# Patient Record
Sex: Female | Born: 1970 | Hispanic: Yes | Marital: Single | Smoking: Never smoker
Health system: Southern US, Community
[De-identification: ages and names within clinical notes are randomized; demographics above are authoritative.]

## PROBLEM LIST (undated history)

## (undated) DIAGNOSIS — F419 Anxiety disorder, unspecified: Secondary | ICD-10-CM

## (undated) DIAGNOSIS — E119 Type 2 diabetes mellitus without complications: Secondary | ICD-10-CM

## (undated) DIAGNOSIS — K219 Gastro-esophageal reflux disease without esophagitis: Secondary | ICD-10-CM

## (undated) DIAGNOSIS — E7800 Pure hypercholesterolemia, unspecified: Secondary | ICD-10-CM

## (undated) HISTORY — PX: OTHER SURGICAL HISTORY: SHX169

## (undated) HISTORY — PX: BREAST SURGERY: SHX581

## (undated) HISTORY — PX: TUBAL LIGATION: SHX77

---

## 2002-02-01 ENCOUNTER — Encounter: Payer: Self-pay | Admitting: Emergency Medicine

## 2002-02-01 ENCOUNTER — Emergency Department (HOSPITAL_COMMUNITY): Admission: EM | Admit: 2002-02-01 | Discharge: 2002-02-01 | Payer: Self-pay | Admitting: Emergency Medicine

## 2002-03-12 ENCOUNTER — Ambulatory Visit (HOSPITAL_COMMUNITY): Admission: RE | Admit: 2002-03-12 | Discharge: 2002-03-12 | Payer: Self-pay | Admitting: General Surgery

## 2002-03-12 ENCOUNTER — Encounter: Payer: Self-pay | Admitting: General Surgery

## 2002-04-03 ENCOUNTER — Encounter: Admission: RE | Admit: 2002-04-03 | Discharge: 2002-04-03 | Payer: Self-pay | Admitting: Oncology

## 2002-04-03 ENCOUNTER — Encounter (HOSPITAL_COMMUNITY): Admission: RE | Admit: 2002-04-03 | Discharge: 2002-05-03 | Payer: Self-pay | Admitting: Oncology

## 2002-04-04 ENCOUNTER — Encounter (HOSPITAL_COMMUNITY): Payer: Self-pay | Admitting: Oncology

## 2002-04-28 ENCOUNTER — Emergency Department (HOSPITAL_COMMUNITY): Admission: EM | Admit: 2002-04-28 | Discharge: 2002-04-28 | Payer: Self-pay | Admitting: Emergency Medicine

## 2002-05-06 ENCOUNTER — Emergency Department (HOSPITAL_COMMUNITY): Admission: EM | Admit: 2002-05-06 | Discharge: 2002-05-06 | Payer: Self-pay | Admitting: Emergency Medicine

## 2002-05-23 ENCOUNTER — Ambulatory Visit (HOSPITAL_COMMUNITY): Admission: RE | Admit: 2002-05-23 | Discharge: 2002-05-23 | Payer: Self-pay | Admitting: Internal Medicine

## 2002-05-24 ENCOUNTER — Encounter: Payer: Self-pay | Admitting: Internal Medicine

## 2002-05-24 ENCOUNTER — Ambulatory Visit (HOSPITAL_COMMUNITY): Admission: RE | Admit: 2002-05-24 | Discharge: 2002-05-24 | Payer: Self-pay | Admitting: Internal Medicine

## 2002-08-15 ENCOUNTER — Ambulatory Visit (HOSPITAL_COMMUNITY): Admission: RE | Admit: 2002-08-15 | Discharge: 2002-08-15 | Payer: Self-pay | Admitting: Internal Medicine

## 2003-07-07 ENCOUNTER — Emergency Department (HOSPITAL_COMMUNITY): Admission: EM | Admit: 2003-07-07 | Discharge: 2003-07-07 | Payer: Self-pay | Admitting: *Deleted

## 2004-02-19 ENCOUNTER — Encounter (HOSPITAL_COMMUNITY): Admission: RE | Admit: 2004-02-19 | Discharge: 2004-03-20 | Payer: Self-pay | Admitting: Preventative Medicine

## 2004-11-30 ENCOUNTER — Ambulatory Visit (HOSPITAL_COMMUNITY): Admission: RE | Admit: 2004-11-30 | Discharge: 2004-11-30 | Payer: Self-pay | Admitting: Otolaryngology

## 2004-12-22 ENCOUNTER — Emergency Department (HOSPITAL_COMMUNITY): Admission: EM | Admit: 2004-12-22 | Discharge: 2004-12-22 | Payer: Self-pay | Admitting: Emergency Medicine

## 2004-12-23 ENCOUNTER — Ambulatory Visit (HOSPITAL_COMMUNITY): Admission: RE | Admit: 2004-12-23 | Discharge: 2004-12-23 | Payer: Self-pay | Admitting: Emergency Medicine

## 2005-01-20 ENCOUNTER — Ambulatory Visit: Payer: Self-pay | Admitting: Urgent Care

## 2005-02-01 ENCOUNTER — Emergency Department (HOSPITAL_COMMUNITY): Admission: EM | Admit: 2005-02-01 | Discharge: 2005-02-01 | Payer: Self-pay | Admitting: Emergency Medicine

## 2006-03-08 ENCOUNTER — Ambulatory Visit (HOSPITAL_COMMUNITY): Admission: RE | Admit: 2006-03-08 | Discharge: 2006-03-08 | Payer: Self-pay | Admitting: Family Medicine

## 2006-04-06 ENCOUNTER — Ambulatory Visit (HOSPITAL_COMMUNITY): Admission: RE | Admit: 2006-04-06 | Discharge: 2006-04-06 | Payer: Self-pay | Admitting: Family Medicine

## 2006-08-04 ENCOUNTER — Emergency Department (HOSPITAL_COMMUNITY): Admission: EM | Admit: 2006-08-04 | Discharge: 2006-08-04 | Payer: Self-pay | Admitting: Emergency Medicine

## 2007-01-11 ENCOUNTER — Emergency Department (HOSPITAL_COMMUNITY): Admission: EM | Admit: 2007-01-11 | Discharge: 2007-01-11 | Payer: Self-pay | Admitting: Emergency Medicine

## 2007-01-12 ENCOUNTER — Emergency Department (HOSPITAL_COMMUNITY): Admission: EM | Admit: 2007-01-12 | Discharge: 2007-01-12 | Payer: Self-pay | Admitting: Emergency Medicine

## 2008-06-18 ENCOUNTER — Emergency Department (HOSPITAL_COMMUNITY): Admission: EM | Admit: 2008-06-18 | Discharge: 2008-06-18 | Payer: Self-pay | Admitting: Emergency Medicine

## 2008-06-24 ENCOUNTER — Ambulatory Visit (HOSPITAL_COMMUNITY): Admission: RE | Admit: 2008-06-24 | Discharge: 2008-06-24 | Payer: Self-pay | Admitting: Family Medicine

## 2008-07-18 ENCOUNTER — Ambulatory Visit (HOSPITAL_COMMUNITY): Admission: RE | Admit: 2008-07-18 | Discharge: 2008-07-18 | Payer: Self-pay | Admitting: Family Medicine

## 2008-07-25 ENCOUNTER — Ambulatory Visit (HOSPITAL_COMMUNITY): Admission: RE | Admit: 2008-07-25 | Discharge: 2008-07-25 | Payer: Self-pay | Admitting: Family Medicine

## 2008-07-25 ENCOUNTER — Encounter (INDEPENDENT_AMBULATORY_CARE_PROVIDER_SITE_OTHER): Payer: Self-pay | Admitting: Radiology

## 2009-02-13 ENCOUNTER — Ambulatory Visit (HOSPITAL_COMMUNITY): Admission: RE | Admit: 2009-02-13 | Discharge: 2009-02-13 | Payer: Self-pay | Admitting: Family Medicine

## 2009-09-30 ENCOUNTER — Ambulatory Visit (HOSPITAL_COMMUNITY): Admission: RE | Admit: 2009-09-30 | Discharge: 2009-09-30 | Payer: Self-pay | Admitting: Family Medicine

## 2010-04-15 ENCOUNTER — Emergency Department (HOSPITAL_COMMUNITY): Admission: EM | Admit: 2010-04-15 | Discharge: 2010-04-15 | Payer: Self-pay | Admitting: Emergency Medicine

## 2010-07-22 ENCOUNTER — Ambulatory Visit (HOSPITAL_COMMUNITY)
Admission: RE | Admit: 2010-07-22 | Discharge: 2010-07-22 | Payer: Self-pay | Source: Home / Self Care | Attending: Family Medicine | Admitting: Family Medicine

## 2010-07-27 ENCOUNTER — Ambulatory Visit (HOSPITAL_COMMUNITY)
Admission: RE | Admit: 2010-07-27 | Discharge: 2010-07-27 | Payer: Self-pay | Source: Home / Self Care | Attending: General Surgery | Admitting: General Surgery

## 2010-08-09 HISTORY — PX: BREAST SURGERY: SHX581

## 2010-10-15 ENCOUNTER — Other Ambulatory Visit: Payer: Self-pay

## 2010-10-15 DIAGNOSIS — Z139 Encounter for screening, unspecified: Secondary | ICD-10-CM

## 2010-10-19 LAB — CBC
Hemoglobin: 13.8 g/dL (ref 12.0–15.0)
Platelets: 207 10*3/uL (ref 150–400)
RBC: 4.31 MIL/uL (ref 3.87–5.11)
WBC: 10.3 10*3/uL (ref 4.0–10.5)

## 2010-10-19 LAB — BASIC METABOLIC PANEL
BUN: 6 mg/dL (ref 6–23)
Chloride: 104 mEq/L (ref 96–112)
GFR calc Af Amer: >60 mL/min (ref >60)
Glucose, Bld: 104 mg/dL — ABNORMAL HIGH (ref 70–99)
Sodium: 139 mEq/L (ref 135–145)

## 2010-10-19 LAB — HCG, QUANTITATIVE, PREGNANCY: hCG, Beta Chain, Quant, S: <2 m[IU]/mL (ref <5)

## 2010-10-19 LAB — SURGICAL PCR SCREEN: MRSA, PCR: NEGATIVE

## 2010-10-23 ENCOUNTER — Ambulatory Visit (HOSPITAL_COMMUNITY)
Admission: RE | Admit: 2010-10-23 | Discharge: 2010-10-23 | Disposition: A | Discharge status: Home / Self Care | Payer: PRIVATE HEALTH INSURANCE | Source: Ambulatory Visit | Attending: Family Medicine | Admitting: Family Medicine

## 2010-10-23 DIAGNOSIS — Z139 Encounter for screening, unspecified: Secondary | ICD-10-CM

## 2010-10-23 DIAGNOSIS — Z1231 Encounter for screening mammogram for malignant neoplasm of breast: Secondary | ICD-10-CM | POA: Insufficient documentation

## 2010-12-25 NOTE — Consult Note (Signed)
NAME:  Mary [name]                       ACCOUNT NO.:  [id number]   MEDICAL RECORD NO.:  [id number]                   PATIENT TYPE:   LOCATION:                                       FACILITY:  [facility]   PHYSICIAN:  R. Roetta Sessions, M.D.              DATE OF BIRTH:  18-Oct-1970   DATE OF CONSULTATION:  05/15/2002  DATE OF DISCHARGE:                                   CONSULTATION   j   REASON FOR CONSULTATION:  Abdominal pain.   HISTORY OF PRESENT ILLNESS:  The patient is a pleasant 40 year old Hispanic  female patient of Dr. Malvin Johns who was referred today for further evaluation  of abdominal pain.  She has a several month history of various GI  complaints.  She was seen by Dr. Colon Branch in the emergency department back in  June 2003.  At that time she was complaining of left lower chest pain.  She  states there was nothing found to explain her pain.  She went back to the  emergency department April 28, 2002 for substernal type pain associated  with shortness of breath and diaphoresis.  She does not recall the diagnosis  at that time.  She reports that she eventually went to [facility] on September 28 and was diagnosed with acid reflux.  She was started on Maalox and Prilosec.  Since then the pain has went away.  She no longer has the substernal pain/pressure that she had had previously.  She denies any nausea other than when she took some Darvocet.  No vomiting.  No dysphagia, odynophagia.  She had seen Dr. Malvin Johns who according to her  thought that her chest pain was secondary to nerves.  She was using Advil  two tablets p.o. p.r.n. chest wall pain, generally once to twice a day  previously.  She reports that she is no longer taking this medication.  Now  her substernal pain has subsided.  However, she is having lower abdominal  pain of one week duration.  She has decreased greasy, fatty foods as well as  caffeinated fluids to try to help the  pain, but noted no relief.  Pain comes  and goes.  Not associated with eating certain foods or movement.  Denies any  fever or chills.  She denies any melena or rectal bleeding or weight loss.  Her bowels move once to twice per week previously, but now daily since she  has been on Prilosec.  Translation was provided by her friend, Tomasita Crumble.   Since her work-up was initiated in June for her chest pain she has had  multiple studies.  On March 01, 2002 she was noted to have a hemoglobin of  11.9, hematocrit 34.9, MCV of 86.4.  Iron studies were normal except for low  saturation of 16.  Ferritin was low normal at 11.  Vitamin B12 and folate  were normal.  She had a chest x-ray which revealed scoliosis, but no other  abnormalities.  Last ferritin on August 26 was 17.  She had a sedimentation  rate of 10.  Rheumatoid factor less than 20.  She saw Dr. Mariel Sleet for  reasons unclear to me, however, apparently was due to anemia felt to be iron  deficient.  I only received part of his note as forwarded from Dr. Malvin Johns.   CURRENT MEDICATIONS:  1. Prilosec 20 mg q.d.  2. Multivitamin q.d.  3. Maalox one tablespoon q.i.d.   ALLERGIES:  No known drug allergies.   PAST MEDICAL HISTORY:  1. History of mild normocytic anemia felt to be iron deficient.  2. Scoliosis based on x-ray.  3. Cesarean section x3.   FAMILY HISTORY:  No family history of chronic GI illnesses, liver disease,  or colon cancer.   SOCIAL HISTORY:  She has been married for 18 years.  Has three children.  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  She has  been living in the [place] for six to eight years.  She speaks and  understands some [place].  Denies any tobacco or alcohol use.   REVIEW OF SYMPTOMS:  Please see HPI for GI, general, cardiopulmonary,  genitourinary.  She has regular menses.  She has moderate amount of flow.   PHYSICAL EXAMINATION:  VITAL SIGNS:  Weight 121.5,  height 5 feet,  temperature 98.2, blood pressure 100/60, pulse 74.  GENERAL:  Pleasant young, well-nourished, well-developed Hispanic female in  no acute distress.  She is accompanied by a friend who is her Nurse, learning disability.  SKIN:  Warm and dry.  No jaundice.  HEENT:  Pupils are equal, round, and reactive to light.  Conjunctivae are  pink.  Sclerae nonicteric.  Oropharyngeal mucosa moist and pink.  No  lesions, erythema, or exudate.  NECK:  No lymphadenopathy, thyromegaly, carotid bruits.  CHEST:  Lungs are clear to auscultation.  CARDIAC:  Regular rate and rhythm.  Normal S1, S2.  No murmur, rub, or  gallop.  ABDOMEN:  Positive bowel sounds, soft, nondistended.  She has moderate  tenderness in the epigastric region to deep palpation.  No organomegaly or  masses.  RECTAL:  Adequate sphincter tone.  No masses in the rectal vault.  Secretions are Hemoccult positive.  EXTREMITIES:  No edema.   IMPRESSION:  The patient is a 40 year old Hispanic female who has had a  multitude of gastrointestinal complaints.  Initially, she was having some  left lower chest wall pain which apparently has resolved.  She has been to  the ED with substernal chest pain felt to be related to acid reflux  according to the discharge papers that she received.  Those symptoms have  now resolved since being on Prilosec and Maalox.  On examination today she  is quite tender in the epigastric region.  She has a history of taking Advil  regularly for chest wall pain.  She was Hemoccult positive.  I am concerned  about NSAID induced mucosal injury or even peptic ulcer disease.  Cannot  rule out biliary disease as the cause of some of her upper abdominal pain.  She complains of lower abdominal pain; however, I believe that there is some  loss of accuracy during translation.  When asked where she hurts she points  to the mid epigastric region.  We discussed today EGD to further evaluate epigastric pain, heme-positive stools  and she is agreeable to proceed.  We  discussed risks, alternatives, and benefits regarding her mild anemia with  low ferritin and iron saturation.  This may be simply secondary to regular  menses; however, do need to rule out slow upper gastrointestinal bleed.   PLAN:  1. EGD in the near future.  2. Will obtain LFTs and CBC.  3.     I did supply an excuse from work until we complete EGD and blood work.  The      patient complained of feeling very weak; however, no physical signs of     significant anemia on examination.   I would like to thank Dr. Malvin Johns for allowing [place] to take part in the care  of this patient.     Tana Coast, Pricilla Larsson, M.D.    LL/MEDQ  D:  05/15/2002  T:  05/16/2002  Job:  [id number]   cc:   Marlane Hatcher, M.D.   Jonathon Bellows, M.D.  [address]  [place]  [place] [address]  Fax: [phone]

## 2010-12-25 NOTE — Procedures (Signed)
[facility]  Patient:    Mary [name], Mary [name] Visit Number: [id number] MRN: [id number]          Service Type: EMS Location: ED Attending Physician:  Annamarie Dawley Dictated by:   Kari Baars, M.D. Proc. Date: 02/01/02 Admit Date:  02/01/2002 Discharge Date: 02/01/2002                            EKG Interpretations  Normal electrocardiogram. Dictated by:   Kari Baars, M.D. Attending Physician:  Annamarie Dawley DD:  02/03/02 TD:  02/05/02 Job: [id number]

## 2010-12-25 NOTE — Op Note (Signed)
NAME:  [name], Mary                       ACCOUNT NO.:  [id number]   MEDICAL RECORD NO.:  [id number]                   PATIENT TYPE:  AMB   LOCATION:  DAY                                  FACILITY:  [facility]   PHYSICIAN:  R. Roetta Sessions, M.D.              DATE OF BIRTH:  [birth year]   DATE OF PROCEDURE:  DATE OF DISCHARGE:                                 OPERATIVE REPORT   PROCEDURE:  Esophagogastroduodenoscopy with Maloney dilation.   ENDOSCOPIST:  Gerrit Friends. Rourk, M.D.   INDICATIONS FOR PROCEDURE:  The patient is a 40 year old lady with recent  epigastric chest pain. She was taken Advil.  This was stopped.  She was  started on Prilosec. Her symptoms have almost completely subsided.  She does  describe intermittent esophageal dysphagia.  Aside from dysphagia, at this  point in time, she is asymptomatic.  As far as I can tell she has not had an  abdominal ultrasound to screen for occult gallstones.  EGD is now being done  to further evaluate her recent chest and epigastric pain and esophageal  dysphagia.  Please see the documentation in the medical record for more  information.  I feel that she is low risk for conscious sedation.   DESCRIPTION OF PROCEDURE:  O2 saturation, blood pressure, pulse and  respirations were monitored throughout the entire procedure.   CONSCIOUS SEDATION:  Versed 4 mg IV, Demerol 50 mg IV in divided doses,   INSTRUMENT:  Olympus video chip adult gastroscope.   FINDINGS:  Examination of the tubular esophagus revealed an incomplete  Schatzki ring at the EG junction.  The esophageal mucosa otherwise appeared  normal.  The EG junction was easily traversed.   STOMACH:  The gastric cavity insufflated well with air.  A thorough  examination of the gastric mucosa including a retroflex view of the proximal  stomach and esophagogastric junction; demonstrated no abnormalities.  The pylorus was patent and easily traversed.   DUODENUM:  The duodenum, the  bulb, and the second portion appeared normal.   THERAPY/DIAGNOSTIC MANEUVERS:  A 54 French Maloney dilator was passed to  full insertion with ease.  A look back revealed no apparent complication  related to the passage of the dilator.  The patient tolerated the procedure  well and was reacted in endoscopy.   IMPRESSION:  1. Incomplete, noncritical appearing Schatzki ring, status post dilation as     described above.  2. The remainder of the esophageal mucosa appeared normal.  3. Normal gastric mucosa.  4. Normal D1, D2.    RECOMMENDATIONS:  1. Continue Prilosec 20 mg orally daily for the time being.  2. Continue to avoid nonsteroidals.  3. Will go ahead and proceed with an abdominal ultrasound just to make sure     that this lady does not have occult gallstones.  4. Office visit with [place] in 4 weeks.  Jonathon Bellows, M.D.    RMR/MEDQ  D:  05/23/2002  T:  05/23/2002  Job:  [id number]   cc:   Barbaraann Barthel, MD

## 2010-12-25 NOTE — Op Note (Signed)
NAME:  [name], Mary                       ACCOUNT NO.:  [id number]   MEDICAL RECORD NO.:  [id number]                   PATIENT TYPE:  AMB   LOCATION:  DAY                                  FACILITY:  [facility]   PHYSICIAN:  R. Roetta Sessions, M.D.              DATE OF BIRTH:  1970-09-07   DATE OF PROCEDURE:  DATE OF DISCHARGE:                                 OPERATIVE REPORT   PROCEDURE PERFORMED:  Diagnostic colonoscopy.   INDICATIONS FOR PROCEDURE:  The patient is a 40 year old Hispanic woman with  Hemoccult-positive stool.  She had a low iron saturation.  Her CBC was  previously normal.  She underwent an esophagogastroduodenoscopy which  demonstrated Schatzki's ring which was dilated, otherwise the upper GI tract  appeared normal.  Subsequent to the EGD, she had one of her Hemoccult cards  positive.  She is really pretty much asymptomatic at this time.  She denies  abdominal pain, no melena or rectal bleeding.  No change in bowel habits.  Colonoscopy is now being done to complete the GI evaluation.  The approach  has been discussed with the patient previously and again today at the  bedside.  Potential risks, benefits, and alternatives have been reviewed.  Please see the documentation in the medical record ASA1.   DESCRIPTION OF PROCEDURE:  O2 saturation, blood pressure and pulse oximeters  were monitored and normal throughout the entire procedure.  Conscious  sedation of Versed 4 mg IV, Demerol 50 mg IV in divided doses.  The  instrument was the Olympus video tip adult colonoscope.  Findings: Digital  rectal examination revealed no abnormalities.  The quality of the prep was  good.  Rectum:  Examination of the rectal mucosa including retroflexion of  the anal verge revealed no abnormalities.  Colon:  The colonic mucosa was  surveyed from the rectosigmoid junction through the left, transverse and  right colon to the area of the appendiceal orifice and ileocecal valve and  cecum.   Structures were well seen and photographed for the record.  The  colonic mucosa and cecum appeared normal from the level of the cecum to the  ileocecal valve.  The scope was slowly withdrawn.  All previously mentioned  mucosal surfaces were again seen and no abnormalities were observed.  The  patient tolerated the procedure well, was reactive at endoscopy.   IMPRESSION:  1. Normal rectum.  2. Normal colon.   RECOMMENDATIONS:  Follow up with [place] in the office in six weeks to make sure  she is doing well prior to concluding the consultation.                                                Jonathon Bellows, M.D.    RMR/MEDQ  D:  08/15/2002  T:  08/15/2002  Job:  [id number]

## 2011-05-27 LAB — URINALYSIS, ROUTINE W REFLEX MICROSCOPIC
Bilirubin Urine: NEGATIVE
Ketones, ur: NEGATIVE
Nitrite: NEGATIVE
Protein, ur: NEGATIVE
Specific Gravity, Urine: <1.005 — ABNORMAL LOW
Urobilinogen, UA: 0.2

## 2011-05-27 LAB — BASIC METABOLIC PANEL
CO2: 22
Chloride: 102
GFR calc Af Amer: >60
Glucose, Bld: 122 — ABNORMAL HIGH
Potassium: 3.4 — ABNORMAL LOW
Sodium: 130 — ABNORMAL LOW

## 2011-05-27 LAB — CBC
HCT: 30.6 — ABNORMAL LOW
Hemoglobin: 10.6 — ABNORMAL LOW
MCHC: 34.6
RBC: 3.54 — ABNORMAL LOW
RDW: 13.9

## 2011-05-27 LAB — URINE MICROSCOPIC-ADD ON

## 2011-05-27 LAB — DIFFERENTIAL
Basophils Absolute: 0
Basophils Relative: 0
Eosinophils Relative: 0
Monocytes Absolute: 0.8 — ABNORMAL HIGH
Monocytes Relative: 11

## 2011-05-27 LAB — URINE CULTURE

## 2011-05-27 LAB — STREP A DNA PROBE: Group A Strep Probe: NEGATIVE

## 2012-03-28 ENCOUNTER — Other Ambulatory Visit (HOSPITAL_COMMUNITY): Payer: Self-pay | Admitting: *Deleted

## 2012-03-28 DIAGNOSIS — Z139 Encounter for screening, unspecified: Secondary | ICD-10-CM

## 2012-03-30 ENCOUNTER — Ambulatory Visit (HOSPITAL_COMMUNITY)
Admission: RE | Admit: 2012-03-30 | Discharge: 2012-03-30 | Disposition: A | Discharge status: Home / Self Care | Payer: PRIVATE HEALTH INSURANCE | Source: Ambulatory Visit | Attending: *Deleted | Admitting: *Deleted

## 2012-03-30 DIAGNOSIS — Z1231 Encounter for screening mammogram for malignant neoplasm of breast: Secondary | ICD-10-CM | POA: Insufficient documentation

## 2012-03-30 DIAGNOSIS — Z139 Encounter for screening, unspecified: Secondary | ICD-10-CM

## 2012-04-20 NOTE — H&P (Signed)
PREOPERATIVE H&P  Chief Complaint: chronic tonsil problems  HPI: Mary [name] is a 41 y.o. female who presents for evaluation of chronic tonsil problems with large tonsils and frequent sore throats. She also has tonsilliths and odor in her mouth.  No past medical history on file. No past surgical history on file. History   Social History  . Marital Status: Married    Spouse Name: N/A    Number of Children: N/A  . Years of Education: N/A   Social History Main Topics  . Smoking status: Not on file  . Smokeless tobacco: Not on file  . Alcohol Use: Not on file  . Drug Use: Not on file  . Sexually Active: Not on file   Other Topics Concern  . Not on file   Social History Narrative  . No narrative on file   No family history on file. Allergies not on file Prior to Admission medications   Not on File     Positive ROS: sore throats  All other systems have been reviewed and were otherwise negative with the exception of those mentioned in the HPI and as above.  Physical Exam: There were no vitals filed for this visit.  General: Alert, no acute distress Oral: Normal oral mucosa and 3+ tonsils Nasal: Clear nasal passages Neck: No palpable adenopathy or thyroid nodules Ear: Ear canal is clear with normal appearing TMs Cardiovascular: Regular rate and rhythm, no murmur.  Respiratory: Clear to auscultation Neurologic: Alert and oriented x 3   Assessment/Plan: chronic tonsillitis  Plan for Procedure(s): TONSILLECTOMY   Dillard Cannon, MD 04/20/2012 6:02 PM

## 2012-04-20 NOTE — Progress Notes (Signed)
Called dr Allene Pyo office to tell them I have been leaving messages on the daughters cell for the past 2 days-no return call-that is the only functioning number-made them aware having problem getting medical info-the daughters number was correct-lm with daughter again.

## 2012-04-21 ENCOUNTER — Encounter (HOSPITAL_BASED_OUTPATIENT_CLINIC_OR_DEPARTMENT_OTHER): Payer: Self-pay | Admitting: Anesthesiology

## 2012-04-21 ENCOUNTER — Encounter (HOSPITAL_BASED_OUTPATIENT_CLINIC_OR_DEPARTMENT_OTHER): Payer: Self-pay | Admitting: *Deleted

## 2012-04-21 ENCOUNTER — Ambulatory Visit (HOSPITAL_BASED_OUTPATIENT_CLINIC_OR_DEPARTMENT_OTHER)
Admission: RE | Admit: 2012-04-21 | Discharge: 2012-04-21 | Disposition: A | Discharge status: Home / Self Care | Payer: Self-pay | Source: Ambulatory Visit | Attending: Otolaryngology | Admitting: Otolaryngology

## 2012-04-21 ENCOUNTER — Encounter (HOSPITAL_BASED_OUTPATIENT_CLINIC_OR_DEPARTMENT_OTHER)
Admission: RE | Disposition: A | Discharge status: Home / Self Care | Payer: Self-pay | Source: Ambulatory Visit | Attending: Otolaryngology

## 2012-04-21 ENCOUNTER — Ambulatory Visit (HOSPITAL_BASED_OUTPATIENT_CLINIC_OR_DEPARTMENT_OTHER): Payer: Self-pay | Admitting: Anesthesiology

## 2012-04-21 DIAGNOSIS — J3501 Chronic tonsillitis: Secondary | ICD-10-CM | POA: Insufficient documentation

## 2012-04-21 HISTORY — PX: TONSILLECTOMY: SHX5217

## 2012-04-21 HISTORY — DX: Anxiety disorder, unspecified: F41.9

## 2012-04-21 HISTORY — DX: Gastro-esophageal reflux disease without esophagitis: K21.9

## 2012-04-21 SURGERY — TONSILLECTOMY
Anesthesia: General | Site: Mouth | Wound class: Clean Contaminated

## 2012-04-21 MED ORDER — AMOXICILLIN 250 MG/5ML PO SUSR: 500.0000 mg | Freq: Two times a day (BID) | ORAL | Status: AC

## 2012-04-21 MED ORDER — OXYCODONE HCL 5 MG/5ML PO SOLN: 5.0000 mg | Freq: Once | ORAL | Status: AC | PRN

## 2012-04-21 MED ORDER — SCOPOLAMINE 1 MG/3DAYS TD PT72
1.0000 | MEDICATED_PATCH | Freq: Once | TRANSDERMAL | Status: DC
  Administered 2012-04-21: 1.5 mg via TRANSDERMAL

## 2012-04-21 MED ORDER — LIDOCAINE HCL (CARDIAC) 20 MG/ML IV SOLN
INTRAVENOUS | Status: DC | PRN
  Administered 2012-04-21: 50 mg via INTRAVENOUS

## 2012-04-21 MED ORDER — ACETAMINOPHEN 10 MG/ML IV SOLN
1000.0000 mg | Freq: Once | INTRAVENOUS | Status: AC
  Administered 2012-04-21: 1000 mg via INTRAVENOUS

## 2012-04-21 MED ORDER — DEXAMETHASONE SODIUM PHOSPHATE 4 MG/ML IJ SOLN
INTRAMUSCULAR | Status: DC | PRN
  Administered 2012-04-21: 10 mg via INTRAVENOUS

## 2012-04-21 MED ORDER — HYDROMORPHONE HCL PF 1 MG/ML IJ SOLN
0.2500 mg | INTRAMUSCULAR | Status: DC | PRN
  Administered 2012-04-21: 0.5 mg via INTRAVENOUS

## 2012-04-21 MED ORDER — PROPOFOL 10 MG/ML IV BOLUS
INTRAVENOUS | Status: DC | PRN
  Administered 2012-04-21: 200 mg via INTRAVENOUS

## 2012-04-21 MED ORDER — FENTANYL CITRATE 0.05 MG/ML IJ SOLN
INTRAMUSCULAR | Status: DC | PRN
  Administered 2012-04-21: 25 ug via INTRAVENOUS
  Administered 2012-04-21: 50 ug via INTRAVENOUS
  Administered 2012-04-21: 25 ug via INTRAVENOUS

## 2012-04-21 MED ORDER — SUCCINYLCHOLINE CHLORIDE 20 MG/ML IJ SOLN
INTRAMUSCULAR | Status: DC | PRN
  Administered 2012-04-21: 100 mg via INTRAVENOUS

## 2012-04-21 MED ORDER — METOCLOPRAMIDE HCL 5 MG/ML IJ SOLN: 10.0000 mg | Freq: Once | INTRAMUSCULAR | Status: DC | PRN

## 2012-04-21 MED ORDER — CEFAZOLIN SODIUM-DEXTROSE 2-3 GM-% IV SOLR
INTRAVENOUS | Status: DC | PRN
  Administered 2012-04-21: 2 g via INTRAVENOUS

## 2012-04-21 MED ORDER — HYDROCODONE-ACETAMINOPHEN 7.5-500 MG/15ML PO SOLN: 10.0000 mL | ORAL | Status: AC | PRN

## 2012-04-21 MED ORDER — LACTATED RINGERS IV SOLN
INTRAVENOUS | Status: DC
  Administered 2012-04-21 (×2): via INTRAVENOUS

## 2012-04-21 MED ORDER — MIDAZOLAM HCL 5 MG/5ML IJ SOLN
INTRAMUSCULAR | Status: DC | PRN
  Administered 2012-04-21: 1 mg via INTRAVENOUS

## 2012-04-21 MED ORDER — OXYCODONE HCL 5 MG PO TABS
5.0000 mg | ORAL_TABLET | Freq: Once | ORAL | Status: AC | PRN
  Administered 2012-04-21: 5 mg via ORAL

## 2012-04-21 SURGICAL SUPPLY — 29 items
BANDAGE COBAN STERILE 2 (GAUZE/BANDAGES/DRESSINGS) IMPLANT
CANISTER SUCTION 1200CC (MISCELLANEOUS) ×2 IMPLANT
CATH ROBINSON RED A/P 12FR (CATHETERS) IMPLANT
CATH ROBINSON RED A/P 14FR (CATHETERS) ×1 IMPLANT
CLOTH BEACON ORANGE TIMEOUT ST (SAFETY) ×2 IMPLANT
COAGULATOR SUCT SWTCH 10FR 6 (ELECTROSURGICAL) IMPLANT
COVER MAYO STAND STRL (DRAPES) ×2 IMPLANT
ELECT COATED BLADE 2.86 ST (ELECTRODE) ×2 IMPLANT
ELECT REM PT RETURN 9FT ADLT (ELECTROSURGICAL) ×2
ELECT REM PT RETURN 9FT PED (ELECTROSURGICAL)
ELECTRODE REM PT RETRN 9FT PED (ELECTROSURGICAL) IMPLANT
ELECTRODE REM PT RTRN 9FT ADLT (ELECTROSURGICAL) IMPLANT
GAUZE SPONGE 4X4 12PLY STRL LF (GAUZE/BANDAGES/DRESSINGS) ×2 IMPLANT
GLOVE ECLIPSE 6.5 STRL STRAW (GLOVE) ×1 IMPLANT
GLOVE SS BIOGEL STRL SZ 7.5 (GLOVE) ×1 IMPLANT
GLOVE SUPERSENSE BIOGEL SZ 7.5 (GLOVE) ×1
GOWN PREVENTION PLUS XLARGE (GOWN DISPOSABLE) ×1 IMPLANT
GOWN PREVENTION PLUS XXLARGE (GOWN DISPOSABLE) IMPLANT
MARKER SKIN DUAL TIP RULER LAB (MISCELLANEOUS) IMPLANT
NS IRRIG 1000ML POUR BTL (IV SOLUTION) ×2 IMPLANT
PENCIL FOOT CONTROL (ELECTRODE) ×2 IMPLANT
SHEET MEDIUM DRAPE 40X70 STRL (DRAPES) ×2 IMPLANT
SOLUTION BUTLER CLEAR DIP (MISCELLANEOUS) ×1 IMPLANT
SPONGE TONSIL 1 RF SGL (DISPOSABLE) IMPLANT
SPONGE TONSIL 1.25 RF SGL STRG (GAUZE/BANDAGES/DRESSINGS) ×1 IMPLANT
SYR BULB 3OZ (MISCELLANEOUS) ×2 IMPLANT
TOWEL OR 17X24 6PK STRL BLUE (TOWEL DISPOSABLE) ×2 IMPLANT
TUBE CONNECTING 20X1/4 (TUBING) ×2 IMPLANT
WATER STERILE IRR 1000ML POUR (IV SOLUTION) ×1 IMPLANT

## 2012-04-21 NOTE — Anesthesia Postprocedure Evaluation (Signed)
Anesthesia Post Note  Patient: Mary [name]  Procedure(s) Performed: Procedure(s) (LRB): TONSILLECTOMY (N/A)  Anesthesia type: General  Patient location: PACU  Post pain: Pain level controlled  Post assessment: Patient's Cardiovascular Status Stable  Last Vitals:  Filed Vitals:   04/21/12 1100  BP: 112/66  Pulse: 76  Temp:   Resp: 17    Post vital signs: Reviewed and stable  Level of consciousness: alert  Complications: No apparent anesthesia complications

## 2012-04-21 NOTE — Interval H&P Note (Signed)
History and Physical Interval Note:  04/21/2012 9:01 AM  Mary [name]  has presented today for surgery, with the diagnosis of chronic tonsillitis   The various methods of treatment have been discussed with the patient and family. After consideration of risks, benefits and other options for treatment, the patient has consented to  Procedure(s) (LRB) with comments: TONSILLECTOMY (N/A) - tonsillectomy as a surgical intervention .  The patient's history has been reviewed, patient examined, no change in status, stable for surgery.  I have reviewed the patient's chart and labs.  Questions were answered to the patient's satisfaction.     Leiya Keesey

## 2012-04-21 NOTE — Anesthesia Preprocedure Evaluation (Signed)
Anesthesia Evaluation  Patient identified by MRN, date of birth, ID band Patient awake    Reviewed: Allergy & Precautions, H&P , NPO status , Patient's Chart, lab work & pertinent test results, reviewed documented beta blocker date and time   Airway Mallampati: II TM Distance: >3 FB Neck ROM: full    Dental   Pulmonary neg pulmonary ROS,  breath sounds clear to auscultation        Cardiovascular negative cardio ROS  Rhythm:regular     Neuro/Psych negative neurological ROS  negative psych ROS   GI/Hepatic Neg liver ROS, GERD-  Medicated and Controlled,  Endo/Other  negative endocrine ROS  Renal/GU negative Renal ROS  negative genitourinary   Musculoskeletal   Abdominal   Peds  Hematology negative hematology ROS (+)   Anesthesia Other Findings See surgeon's H&P   Reproductive/Obstetrics negative OB ROS                           Anesthesia Physical Anesthesia Plan  ASA: II  Anesthesia Plan: General   Post-op Pain Management:    Induction: Intravenous  Airway Management Planned: Oral ETT  Additional Equipment:   Intra-op Plan:   Post-operative Plan: Extubation in OR  Informed Consent: I have reviewed the patients History and Physical, chart, labs and discussed the procedure including the risks, benefits and alternatives for the proposed anesthesia with the patient or authorized representative who has indicated his/her understanding and acceptance.   Dental Advisory Given  Plan Discussed with: CRNA and Surgeon  Anesthesia Plan Comments:         Anesthesia Quick Evaluation  

## 2012-04-21 NOTE — Brief Op Note (Signed)
04/21/2012  10:06 AM  PATIENT:  Mary [name]  41 y.o. female  PRE-OPERATIVE DIAGNOSIS:  chronic tonsillitis   POST-OPERATIVE DIAGNOSIS:  chronic tonsillitis   PROCEDURE:  Procedure(s) (LRB) with comments: TONSILLECTOMY (N/A) - tonsillectomy  SURGEON:  Surgeon(s) and Role:    * Drema Halon, MD - Primary  PHYSICIAN ASSISTANT:   ASSISTANTS: none   ANESTHESIA:   general  EBL:  Total I/O In: 1000 [I.V.:1000] Out: -   BLOOD ADMINISTERED:none  DRAINS: none   LOCAL MEDICATIONS USED:  NONE  SPECIMEN:  No Specimen  DISPOSITION OF SPECIMEN:  N/A  COUNTS:  YES  TOURNIQUET:  * No tourniquets in log *  DICTATION: .Other Dictation: Dictation Number [id number]  PLAN OF CARE: Discharge to home after PACU  PATIENT DISPOSITION:  PACU - hemodynamically stable.   Delay start of Pharmacological VTE agent (>24hrs) due to surgical blood loss or risk of bleeding: not applicable

## 2012-04-21 NOTE — Transfer of Care (Signed)
Immediate Anesthesia Transfer of Care Note  Patient: Mary [name]  Procedure(s) Performed: Procedure(s) (LRB) with comments: TONSILLECTOMY (N/A) - tonsillectomy  Patient Location: PACU  Anesthesia Type: General  Level of Consciousness: awake, alert  and oriented  Airway & Oxygen Therapy: Patient Spontanous Breathing and Patient connected to face mask oxygen  Post-op Assessment: Report given to PACU RN and Post -op Vital signs reviewed and stable  Post vital signs: Reviewed and stable  Complications: No apparent anesthesia complications

## 2012-04-22 NOTE — Op Note (Signed)
NAMERush Farmer   ACCOUNT NO.:  [id number]  MEDICAL RECORD NO.:  [id number]  LOCATION:                                 FACILITY:  PHYSICIAN:  Kristine Garbe. Ezzard Standing, M.D. DATE OF BIRTH:  DATE OF PROCEDURE: DATE OF DISCHARGE:                              OPERATIVE REPORT   PREOPERATIVE DIAGNOSIS:  Chronic tonsillitis.  POSTOPERATIVE DIAGNOSIS:  Chronic tonsillitis.  OPERATION PERFORMED:  Tonsillectomy.  SURGEON:  Kristine Garbe. Ezzard Standing, M.D.  ANESTHESIA:  General endotracheal.  COMPLICATIONS:  None.  BRIEF CLINICAL NOTE:  Mary [name] is a 41 year old female who has had chronic problems with recurrent sore throats as well as enlarged tonsils.  She complained of frequent sore throat as well as chronic odor in her mouth.  She has large cryptic tonsils.  She was taken to the operating room at this time for tonsillectomy.  DESCRIPTION OF PROCEDURE:  After adequate endotracheal anesthesia, mouth gag was used to expose the oropharynx.  The left and right tonsils were then resected from the tonsillar fossa using a cautery.  There was really no apparent tonsillar abnormalities noted.  Hemostasis was obtained with cautery.  After obtaining adequate hemostasis, oropharynx was irrigated with saline.  Care was taken to preserve the anteroposterior tonsillar pillars as well as uvula.  Following tonsillectomy, the patient was awakened from anesthesia and transferred to the recovery room, postop doing well.  DISPOSITION:  The patient received 2 g of Ancef and 10 mg of Decadron IV preoperatively.  She is discharged to home on amoxicillin suspension 500 mg b.i.d. for 1 week, Tylenol and Lortab Elixir 10-15 mL q.4 hours p.r.n. pain.  However, follow up in my office in 10-14 days for recheck.          ______________________________ Kristine Garbe. Ezzard Standing, M.D.     CEN/MEDQ  D:  04/21/2012  T:  04/22/2012  Job:  [id number]

## 2012-04-25 ENCOUNTER — Encounter (HOSPITAL_BASED_OUTPATIENT_CLINIC_OR_DEPARTMENT_OTHER): Payer: Self-pay | Admitting: Otolaryngology

## 2013-04-24 ENCOUNTER — Other Ambulatory Visit (HOSPITAL_COMMUNITY): Payer: Self-pay | Admitting: *Deleted

## 2013-04-24 DIAGNOSIS — Z139 Encounter for screening, unspecified: Secondary | ICD-10-CM

## 2013-04-30 ENCOUNTER — Ambulatory Visit (HOSPITAL_COMMUNITY)
Admission: RE | Admit: 2013-04-30 | Discharge: 2013-04-30 | Disposition: A | Discharge status: Home / Self Care | Payer: Self-pay | Source: Ambulatory Visit | Attending: *Deleted | Admitting: *Deleted

## 2013-04-30 DIAGNOSIS — Z139 Encounter for screening, unspecified: Secondary | ICD-10-CM

## 2013-05-03 ENCOUNTER — Other Ambulatory Visit (HOSPITAL_COMMUNITY): Payer: Self-pay | Admitting: Nurse Practitioner

## 2013-05-03 DIAGNOSIS — N6320 Unspecified lump in the left breast, unspecified quadrant: Secondary | ICD-10-CM

## 2013-05-09 ENCOUNTER — Other Ambulatory Visit (HOSPITAL_COMMUNITY): Payer: Self-pay | Admitting: Nurse Practitioner

## 2013-05-09 ENCOUNTER — Encounter (HOSPITAL_COMMUNITY): Payer: Self-pay

## 2013-05-09 ENCOUNTER — Ambulatory Visit (HOSPITAL_COMMUNITY)
Admission: RE | Admit: 2013-05-09 | Discharge: 2013-05-09 | Disposition: A | Discharge status: Home / Self Care | Payer: PRIVATE HEALTH INSURANCE | Source: Ambulatory Visit | Attending: Nurse Practitioner | Admitting: Nurse Practitioner

## 2013-05-09 DIAGNOSIS — N6320 Unspecified lump in the left breast, unspecified quadrant: Secondary | ICD-10-CM

## 2013-05-09 DIAGNOSIS — R928 Other abnormal and inconclusive findings on diagnostic imaging of breast: Secondary | ICD-10-CM | POA: Insufficient documentation

## 2013-08-31 ENCOUNTER — Encounter: Payer: Self-pay | Admitting: Obstetrics and Gynecology

## 2013-09-07 ENCOUNTER — Encounter: Payer: Self-pay | Admitting: Obstetrics and Gynecology

## 2013-09-07 ENCOUNTER — Encounter (INDEPENDENT_AMBULATORY_CARE_PROVIDER_SITE_OTHER): Payer: Self-pay

## 2013-09-07 ENCOUNTER — Ambulatory Visit (INDEPENDENT_AMBULATORY_CARE_PROVIDER_SITE_OTHER): Payer: Self-pay | Admitting: Obstetrics and Gynecology

## 2013-09-07 VITALS — BP 100/60 | Ht 62.0 in | Wt 151.0 lb

## 2013-09-07 DIAGNOSIS — N898 Other specified noninflammatory disorders of vagina: Secondary | ICD-10-CM

## 2013-09-07 LAB — POCT WET PREP WITH KOH
Clue Cells Wet Prep HPF POC: NEGATIVE
Epithelial Wet Prep HPF POC: NORMAL
KOH PREP POC: NEGATIVE
Trichomonas, UA: NEGATIVE
Yeast Wet Prep HPF POC: NEGATIVE

## 2013-09-07 NOTE — Progress Notes (Signed)
This chart was scribed by Bennett Scrapehristina Taylor, Medical Scribe, for Dr. Christin BachJohn Evy Lutterman on 09/07/13 at 9:14 AM. This chart was reviewed by Dr. Christin BachJohn Katalia Choma and is accurate.    [facility] Visit  Patient name: Mary [name] MRN [id number]  Date of birth: 07/08/1971  CC & HPI:  Mary [name] is a 43 y.o. female presenting today for white vaginal discharge noticed intermittently after sexual intercourse. Usually after her menses rather than before. No odor or vaginal pain associated. LNMP was one week ago. [facility]  ROS:  + for white vaginal discharge.  + right breast pain. [facility]. Prior normal mammogram Denies any other complaints.   Pertinent History Reviewed:  Medical & Surgical Hx:  Reviewed: No Significant History Medications: Reviewed & Updated - see associated section Social History: Reviewed -  reports that she has never smoked. She has never used smokeless tobacco.  Objective Findings:  Vitals: BP 100/60  Ht 5\' 2"  (1.575 m)  Wt 151 lb (68.493 kg)  BMI 27.61 kg/m2  LMP 08/31/2013 Chaperone present during exam. Exam completed with pt's permission with no discomfort or complications. Physical Examination: General appearance - alert, well appearing, and in no distress and oriented to person, place, and time Mental status - alert, oriented to person, place, and time, normal mood, behavior, speech, dress, motor activity, and thought processes Pelvic - normal external genitalia, vulva, vagina, cervix, uterus and adnexa, VULVA: normal appearing vulva with no masses, tenderness or lesions, VAGINA: vaginal discharge - clear to white odorless discharge, CERVIX: normal appearing cervix without discharge or lesions, wet prep is negative [facility] collected    Assessment & Plan:  NOrmal vaginal secretions  Informed pt that testing was normal. Secretions are normal and reflect her fertility. [facility]  collected, results back in 3 days. Will call the patient with results. Pt verbalized understanding.  Pt mentions at end of visit that she may return for evaluation of rt breast pain,evaluated with [facility]

## 2013-09-08 LAB — GC/CHLAMYDIA PROBE AMP
CT Probe RNA: NEGATIVE
GC Probe RNA: NEGATIVE

## 2013-09-10 ENCOUNTER — Telehealth: Payer: Self-pay | Admitting: Obstetrics and Gynecology

## 2013-09-10 NOTE — Telephone Encounter (Signed)
PT INFORMED OF RESULTS.

## 2014-05-14 ENCOUNTER — Other Ambulatory Visit (HOSPITAL_COMMUNITY): Payer: Self-pay | Admitting: *Deleted

## 2014-05-14 DIAGNOSIS — Z139 Encounter for screening, unspecified: Secondary | ICD-10-CM

## 2014-05-20 ENCOUNTER — Ambulatory Visit (HOSPITAL_COMMUNITY)
Admission: RE | Admit: 2014-05-20 | Discharge: 2014-05-20 | Disposition: A | Discharge status: Home / Self Care | Payer: PRIVATE HEALTH INSURANCE | Source: Ambulatory Visit | Attending: *Deleted | Admitting: *Deleted

## 2014-05-20 DIAGNOSIS — Z1231 Encounter for screening mammogram for malignant neoplasm of breast: Secondary | ICD-10-CM | POA: Insufficient documentation

## 2014-05-20 DIAGNOSIS — Z139 Encounter for screening, unspecified: Secondary | ICD-10-CM

## 2015-03-12 ENCOUNTER — Emergency Department (HOSPITAL_COMMUNITY): Payer: PRIVATE HEALTH INSURANCE

## 2015-03-12 ENCOUNTER — Emergency Department (HOSPITAL_COMMUNITY)
Admission: EM | Admit: 2015-03-12 | Discharge: 2015-03-12 | Disposition: A | Discharge status: Home / Self Care | Payer: PRIVATE HEALTH INSURANCE | Attending: Emergency Medicine | Admitting: Emergency Medicine

## 2015-03-12 ENCOUNTER — Encounter (HOSPITAL_COMMUNITY): Payer: Self-pay

## 2015-03-12 ENCOUNTER — Emergency Department (HOSPITAL_COMMUNITY): Payer: Self-pay

## 2015-03-12 DIAGNOSIS — Z79899 Other long term (current) drug therapy: Secondary | ICD-10-CM | POA: Insufficient documentation

## 2015-03-12 DIAGNOSIS — K5900 Constipation, unspecified: Secondary | ICD-10-CM | POA: Insufficient documentation

## 2015-03-12 DIAGNOSIS — E780 Pure hypercholesterolemia: Secondary | ICD-10-CM | POA: Insufficient documentation

## 2015-03-12 DIAGNOSIS — M545 Low back pain: Secondary | ICD-10-CM | POA: Insufficient documentation

## 2015-03-12 DIAGNOSIS — Z3202 Encounter for pregnancy test, result negative: Secondary | ICD-10-CM | POA: Insufficient documentation

## 2015-03-12 HISTORY — DX: Pure hypercholesterolemia, unspecified: E78.00

## 2015-03-12 LAB — URINALYSIS, ROUTINE W REFLEX MICROSCOPIC
Bilirubin Urine: NEGATIVE
Glucose, UA: NEGATIVE mg/dL
Ketones, ur: NEGATIVE mg/dL
Leukocytes, UA: NEGATIVE
Nitrite: NEGATIVE
PROTEIN: NEGATIVE mg/dL
Urobilinogen, UA: 0.2 mg/dL (ref 0.0–1.0)
pH: 6 (ref 5.0–8.0)

## 2015-03-12 LAB — BASIC METABOLIC PANEL
ANION GAP: 8 (ref 5–15)
BUN: 9 mg/dL (ref 6–20)
CALCIUM: 8.4 mg/dL — AB (ref 8.9–10.3)
CO2: 23 mmol/L (ref 22–32)
Chloride: 106 mmol/L (ref 101–111)
Creatinine, Ser: 0.41 mg/dL — ABNORMAL LOW (ref 0.44–1.00)
GFR calc Af Amer: >60 mL/min (ref >60)
GLUCOSE: 103 mg/dL — AB (ref 65–99)
POTASSIUM: 3.7 mmol/L (ref 3.5–5.1)
Sodium: 137 mmol/L (ref 135–145)

## 2015-03-12 LAB — CBC WITH DIFFERENTIAL/PLATELET
BASOS ABS: 0 10*3/uL (ref 0.0–0.1)
Basophils Relative: 0 % (ref 0–1)
EOS PCT: 2 % (ref 0–5)
Eosinophils Absolute: 0.2 10*3/uL (ref 0.0–0.7)
HEMATOCRIT: 30.4 % — AB (ref 36.0–46.0)
HEMOGLOBIN: 9.7 g/dL — AB (ref 12.0–15.0)
Lymphocytes Relative: 26 % (ref 12–46)
Lymphs Abs: 1.7 10*3/uL (ref 0.7–4.0)
MCH: 25.9 pg — ABNORMAL LOW (ref 26.0–34.0)
MCHC: 31.9 g/dL (ref 30.0–36.0)
MCV: 81.1 fL (ref 78.0–100.0)
Monocytes Absolute: 0.7 10*3/uL (ref 0.1–1.0)
Monocytes Relative: 11 % (ref 3–12)
NEUTROS ABS: 3.8 10*3/uL (ref 1.7–7.7)
Neutrophils Relative %: 61 % (ref 43–77)
PLATELETS: 198 10*3/uL (ref 150–400)
RBC: 3.75 MIL/uL — ABNORMAL LOW (ref 3.87–5.11)
RDW: 15.8 % — ABNORMAL HIGH (ref 11.5–15.5)
WBC: 6.3 10*3/uL (ref 4.0–10.5)

## 2015-03-12 LAB — HEPATIC FUNCTION PANEL
ALK PHOS: 86 U/L (ref 38–126)
ALT: 34 U/L (ref 14–54)
AST: 30 U/L (ref 15–41)
Albumin: 3.4 g/dL — ABNORMAL LOW (ref 3.5–5.0)
Bilirubin, Direct: <0.1 mg/dL — ABNORMAL LOW (ref 0.1–0.5)
Total Bilirubin: 0.3 mg/dL (ref 0.3–1.2)
Total Protein: 6.4 g/dL — ABNORMAL LOW (ref 6.5–8.1)

## 2015-03-12 LAB — URINE MICROSCOPIC-ADD ON

## 2015-03-12 LAB — LIPASE, BLOOD: Lipase: 21 U/L — ABNORMAL LOW (ref 22–51)

## 2015-03-12 LAB — PREGNANCY, URINE: PREG TEST UR: NEGATIVE

## 2015-03-12 LAB — TROPONIN I: Troponin I: <0.03 ng/mL (ref <0.031)

## 2015-03-12 MED ORDER — KETOROLAC TROMETHAMINE 30 MG/ML IJ SOLN
30.0000 mg | Freq: Once | INTRAMUSCULAR | Status: AC
  Administered 2015-03-12: 30 mg via INTRAVENOUS
  Filled 2015-03-12: qty 1

## 2015-03-12 MED ORDER — IOHEXOL 300 MG/ML  SOLN
50.0000 mL | Freq: Once | INTRAMUSCULAR | Status: AC | PRN
  Administered 2015-03-12: 50 mL via ORAL

## 2015-03-12 MED ORDER — LACTULOSE 20 G PO PACK: 20.0000 g | PACK | Freq: Every day | ORAL | Status: DC

## 2015-03-12 MED ORDER — IOHEXOL 300 MG/ML  SOLN
100.0000 mL | Freq: Once | INTRAMUSCULAR | Status: AC | PRN
  Administered 2015-03-12: 100 mL via INTRAVENOUS

## 2015-03-12 NOTE — ED Notes (Signed)
Pt reports epigastric pain that started Sunday morning then it started radiating through to back Sunday night.  Denies n/v.  Reports slight SOB this morning.

## 2015-03-12 NOTE — Discharge Instructions (Signed)

## 2015-03-14 NOTE — ED Provider Notes (Signed)
CSN: [id number]     Arrival date & time 03/12/15  1018 History   First MD Initiated Contact with Patient 03/12/15 1115     Chief Complaint  Patient presents with  . Abdominal Pain     (Consider location/radiation/quality/duration/timing/severity/associated sxs/prior Treatment) HPI   Mary [name] is a 44 y.o. female who presents to the Emergency Department complaining of upper abdominal pain that began three days ago.  She describes a sharp, intermittent pain to her upper stomach that, at times, "takes my breath" she also states that her mid to lower back feels "tight".  Pain is not associated with food intake.  Nothing makes the symptoms better or worse.  She has not tried any therapies prior to arrival.  She denies fever, chills, vomiting, diarrhea, nausea, CP, or similar symptoms in the past.  She reports a regular, healthy diet.  Denies alcohol or drug use.    Past Medical History  Diagnosis Date  . Hypercholesterolemia    Past Surgical History  Procedure Laterality Date  . Breast surgery Right 2012    small lump in breast removed  . Cesarean section      3 c-section   . Tubal ligation     Family History  Problem Relation Age of Onset  . Diabetes Mother   . Cancer Sister 91    ovarian   . Diabetes Brother   . Diabetes Sister   . Diabetes Sister    History  Substance Use Topics  . Smoking status: Never Smoker   . Smokeless tobacco: Never Used  . Alcohol Use: No   OB History    No data available     Review of Systems  Constitutional: Negative for fever, chills and appetite change.  Respiratory: Negative for chest tightness and shortness of breath.   Cardiovascular: Negative for chest pain.  Gastrointestinal: Positive for abdominal pain. Negative for nausea, vomiting, diarrhea, blood in stool and abdominal distention.  Genitourinary: Negative for dysuria, flank pain, decreased urine volume and difficulty urinating.  Musculoskeletal: Positive for back pain.    Skin: Negative for color change and rash.  Neurological: Negative for dizziness, weakness and numbness.  Hematological: Negative for adenopathy.  All other systems reviewed and are negative.     Allergies  Review of patient's allergies indicates no known allergies.  Home Medications   Prior to Admission medications   Medication Sig Start Date End Date Taking? Authorizing Provider  acetaminophen (TYLENOL) 500 MG tablet Take 1,000 mg by mouth every 6 (six) hours as needed for moderate pain.   Yes Historical Provider, MD  DOCQLACE 100 MG capsule Take 1 capsule by mouth 2 (two) times daily. 03/04/15  Yes Historical Provider, MD  lovastatin (MEVACOR) 20 MG tablet Take 20 mg by mouth at bedtime.   Yes Historical Provider, MD  Multiple Vitamin (MULTIVITAMIN) tablet Take 1 tablet by mouth daily.   Yes Historical Provider, MD  lactulose (CEPHULAC) 20 G packet Take 1 packet (20 g total) by mouth daily. Until improvement of the constipation 03/12/15   Lyllie Cobbins, PA-C   BP 109/59 mmHg  Pulse 74  Resp 20  Ht  (1.575 m)  Wt 135 lb (61.236 kg)  BMI 24.69 kg/m2  SpO2 100%  LMP 02/16/2015 Physical Exam  Constitutional: She is oriented to person, place, and time. She appears well-developed and well-nourished. No distress.  HENT:  Head: Normocephalic and atraumatic.  Mouth/Throat: Oropharynx is clear and moist.  Cardiovascular: Normal rate, regular rhythm, normal heart sounds  and intact distal pulses.   No murmur heard. Pulmonary/Chest: Effort normal and breath sounds normal. No respiratory distress.  Abdominal: Soft. Normal appearance and bowel sounds are normal. She exhibits no distension and no mass. There is no hepatosplenomegaly. There is tenderness in the epigastric area. There is no rigidity, no rebound, no guarding, no CVA tenderness and no tenderness at McBurney's point.    Musculoskeletal: Normal range of motion. She exhibits no edema.  Neurological: She is alert and oriented  to person, place, and time. She exhibits normal muscle tone. Coordination normal.  Skin: Skin is warm and dry.  Psychiatric: She has a normal mood and affect.  Nursing note and vitals reviewed.   ED Course  Procedures (including critical care time) Labs Review Labs Reviewed  CBC WITH DIFFERENTIAL/PLATELET - Abnormal; Notable for the following:    RBC 3.75 (*)    Hemoglobin 9.7 (*)    HCT 30.4 (*)    MCH 25.9 (*)    RDW 15.8 (*)    All other components within normal limits  BASIC METABOLIC PANEL - Abnormal; Notable for the following:    Glucose, Bld 103 (*)    Creatinine, Ser 0.41 (*)    Calcium 8.4 (*)    All other components within normal limits  URINALYSIS, ROUTINE W REFLEX MICROSCOPIC (NOT AT [facility]) - Abnormal; Notable for the following:    Specific Gravity, Urine <1.005 (*)    Hgb urine dipstick TRACE (*)    All other components within normal limits  HEPATIC FUNCTION PANEL - Abnormal; Notable for the following:    Total Protein 6.4 (*)    Albumin 3.4 (*)    Bilirubin, Direct <0.1 (*)    All other components within normal limits  LIPASE, BLOOD - Abnormal; Notable for the following:    Lipase 21 (*)    All other components within normal limits  URINE MICROSCOPIC-ADD ON - Abnormal; Notable for the following:    Squamous Epithelial / LPF MANY (*)    All other components within normal limits  TROPONIN I  PREGNANCY, URINE    Imaging Review Dg Chest 2 View  03/12/2015   CLINICAL DATA:  Epigastric pain radiating to the back for the past 3 days, onset of shortness of breath this morning  EXAM: CHEST  2 VIEW  COMPARISON:  CT scan of the chest of April 04, 2002.  FINDINGS: The lungs are well-expanded and clear. There is a stable calcified 8 mm diameter right lower lobe nodule. The heart and pulmonary vascularity are normal. The mediastinum is normal in width. There is no pleural effusion. The bony thorax is unremarkable.  IMPRESSION: There is no active cardiopulmonary disease.    Electronically Signed   By: David  [place] M.D.   On: 03/12/2015 11:27   Ct Abdomen Pelvis W Contrast  03/12/2015   CLINICAL DATA:  Epigastric pain  EXAM:  CT ABDOMEN AND PELVIS WITH CONTRAST  TECHNIQUE: Multidetector CT imaging of the abdomen and pelvis was performed using the standard protocol following bolus administration of intravenous contrast.  CONTRAST:  OMNIPAQUE IOHEXOL 300 MG/ML SOLN, 50mL OMNIPAQUE IOHEXOL 300 MG/ML SOLN  COMPARISON:  None.  FINDINGS: Lower chest:  Lung bases are clear.  Heart size normal.  Hepatobiliary: Hypodensity anterior liver adjacent to the falciform ligament compatible with fatty infiltration. No liver mass. Gallbladder and bile ducts normal.  Pancreas: Negative  Spleen: Negative  Adrenals/Urinary Tract: Mild dilatation of the right renal collecting system and right ureter. No obstructing  ureteral calculus. Urinary bladder is significantly distended. Distended urinary bladder or right adnexal cyst could be causing dilatation of the right ureter.  Stomach/Bowel: Negative for bowel obstruction or bowel thickening. Normal appendix. Moderate stool in the colon.  Vascular/Lymphatic: Negative  Reproductive: Normal uterus. Corpus luteum cyst left adnexa. 2.5 cm right adnexal cyst.  Other: No free fluid.  Negative for adenopathy.  Musculoskeletal: Lumbar scoliosis without focal bony abnormality.  IMPRESSION: Mild dilatation of the right renal collecting system. No obstructing stone identified. Right ureter may be dilated due distended urinary bladder or right adnexal cyst. No renal calculi identified  Normal appendix.  Mild constipation without bowel obstruction.   Electronically Signed   By: Marlan Palau M.D.   On: 03/12/2015 13:52     EKG Interpretation   Date/Time:  Wednesday March 12 2015 11:00:48 EDT Ventricular Rate:  72 PR Interval:  161 QRS Duration: 96 QT Interval:  383 QTC Calculation: 419 R Axis:   38 Text Interpretation:  Sinus rhythm Low voltage,  precordial leads Confirmed  by ZACKOWSKI  MD, SCOTT ([id number]) on 03/12/2015 11:04:27 AM      MDM   Final diagnoses:  Constipation, unspecified constipation type    Pt is well appearing, non-toxic.  Vitals stable.  No concerning sx's for surgical abdomen or cardiac process..    1420  Pt is resting comfortably, labs are reassuring, CT scan of abd/pelvis shows dilatation of the right renal collecting system, but I feel pt's pain is unlikely related to this given she has not had any urinary sx's.  Scan also shows constipation which patient reports is a frequent problem for her.  She appears stable for d/c, I have written rx for lactulose and instructed her to return here for any worsening sx's.  She verbalized understanding and agrees to plan    Pauline Aus, PA-C 03/14/15 [id number]  Vanetta Mulders, MD 03/15/15 [phone]

## 2015-05-20 ENCOUNTER — Other Ambulatory Visit (HOSPITAL_COMMUNITY): Payer: Self-pay | Admitting: *Deleted

## 2015-05-20 DIAGNOSIS — Z1231 Encounter for screening mammogram for malignant neoplasm of breast: Secondary | ICD-10-CM

## 2015-05-26 ENCOUNTER — Ambulatory Visit (HOSPITAL_COMMUNITY)
Admission: RE | Admit: 2015-05-26 | Discharge: 2015-05-26 | Disposition: A | Discharge status: Home / Self Care | Payer: PRIVATE HEALTH INSURANCE | Source: Ambulatory Visit | Attending: *Deleted | Admitting: *Deleted

## 2015-05-26 DIAGNOSIS — Z1231 Encounter for screening mammogram for malignant neoplasm of breast: Secondary | ICD-10-CM | POA: Diagnosis not present

## 2015-05-28 ENCOUNTER — Ambulatory Visit (HOSPITAL_COMMUNITY): Payer: PRIVATE HEALTH INSURANCE

## 2016-01-23 ENCOUNTER — Encounter (HOSPITAL_COMMUNITY): Payer: Self-pay

## 2016-01-23 ENCOUNTER — Emergency Department (HOSPITAL_COMMUNITY)
Admission: EM | Admit: 2016-01-23 | Discharge: 2016-01-23 | Disposition: A | Discharge status: Home / Self Care | Payer: Self-pay | Attending: Emergency Medicine | Admitting: Emergency Medicine

## 2016-01-23 ENCOUNTER — Emergency Department (HOSPITAL_COMMUNITY): Payer: Self-pay

## 2016-01-23 DIAGNOSIS — E7800 Pure hypercholesterolemia, unspecified: Secondary | ICD-10-CM | POA: Insufficient documentation

## 2016-01-23 DIAGNOSIS — R42 Dizziness and giddiness: Secondary | ICD-10-CM | POA: Insufficient documentation

## 2016-01-23 DIAGNOSIS — R110 Nausea: Secondary | ICD-10-CM | POA: Insufficient documentation

## 2016-01-23 HISTORY — DX: Type 2 diabetes mellitus without complications: E11.9

## 2016-01-23 LAB — CBC WITH DIFFERENTIAL/PLATELET
BASOS PCT: 1 %
Basophils Absolute: 0.1 10*3/uL (ref 0.0–0.1)
Eosinophils Absolute: 0.2 10*3/uL (ref 0.0–0.7)
Eosinophils Relative: 3 %
HCT: 39.7 % (ref 36.0–46.0)
Hemoglobin: 13.2 g/dL (ref 12.0–15.0)
Lymphocytes Relative: 46 %
Lymphs Abs: 3.3 10*3/uL (ref 0.7–4.0)
MCH: 29.8 pg (ref 26.0–34.0)
MCHC: 33.2 g/dL (ref 30.0–36.0)
MCV: 89.6 fL (ref 78.0–100.0)
MONO ABS: 0.4 10*3/uL (ref 0.1–1.0)
MONOS PCT: 6 %
NEUTROS ABS: 3.1 10*3/uL (ref 1.7–7.7)
Neutrophils Relative %: 44 %
Platelets: 192 10*3/uL (ref 150–400)
RBC: 4.43 MIL/uL (ref 3.87–5.11)
RDW: 14.1 % (ref 11.5–15.5)
WBC: 7.1 10*3/uL (ref 4.0–10.5)

## 2016-01-23 LAB — URINALYSIS, ROUTINE W REFLEX MICROSCOPIC
BILIRUBIN URINE: NEGATIVE
GLUCOSE, UA: NEGATIVE mg/dL
HGB URINE DIPSTICK: NEGATIVE
KETONES UR: NEGATIVE mg/dL
LEUKOCYTES UA: NEGATIVE
Nitrite: NEGATIVE
PH: 6.5 (ref 5.0–8.0)
PROTEIN: NEGATIVE mg/dL
Specific Gravity, Urine: <1.005 — ABNORMAL LOW (ref 1.005–1.030)

## 2016-01-23 LAB — TROPONIN I: Troponin I: <0.03 ng/mL (ref <0.031)

## 2016-01-23 LAB — CBG MONITORING, ED: Glucose-Capillary: 98 mg/dL (ref 65–99)

## 2016-01-23 LAB — BASIC METABOLIC PANEL
Anion gap: 6 (ref 5–15)
BUN: 13 mg/dL (ref 6–20)
CHLORIDE: 106 mmol/L (ref 101–111)
CO2: 27 mmol/L (ref 22–32)
CREATININE: 0.42 mg/dL — AB (ref 0.44–1.00)
Calcium: 9.1 mg/dL (ref 8.9–10.3)
GFR calc Af Amer: >60 mL/min (ref >60)
GFR calc non Af Amer: >60 mL/min (ref >60)
GLUCOSE: 99 mg/dL (ref 65–99)
Potassium: 3.7 mmol/L (ref 3.5–5.1)
Sodium: 139 mmol/L (ref 135–145)

## 2016-01-23 LAB — PREGNANCY, URINE: Preg Test, Ur: NEGATIVE

## 2016-01-23 MED ORDER — ONDANSETRON HCL 4 MG/2ML IJ SOLN
4.0000 mg | Freq: Once | INTRAMUSCULAR | Status: AC
  Administered 2016-01-23: 4 mg via INTRAVENOUS
  Filled 2016-01-23: qty 2

## 2016-01-23 MED ORDER — SODIUM CHLORIDE 0.9 % IV BOLUS (SEPSIS)
1000.0000 mL | Freq: Once | INTRAVENOUS | Status: AC
  Administered 2016-01-23: 1000 mL via INTRAVENOUS

## 2016-01-23 NOTE — ED Notes (Signed)
Patient ambulated around nursing station without diffucult

## 2016-01-23 NOTE — ED Provider Notes (Signed)
CSN: [id number]     Arrival date & time 01/23/16  1642 History   First MD Initiated Contact with Patient 01/23/16 1742     Chief Complaint  Patient presents with  . Dizziness  . Altered Mental Status     (Consider location/radiation/quality/duration/timing/severity/associated sxs/prior Treatment) HPI Comments: Patient with intermittent dizziness and lightheadedness over the past day. States symptoms started last night that she was getting out of the shower and drying her hair she became dizzy and lightheaded with room spinning and had a sit down. This resolved after a few minutes. It was associated with nausea. No vomiting. No headache. No fever. No focal weakness, numbness or tingling. Patient felt better today but has still has some intermittent dizziness and nausea and a dry mouth. No difficulty speaking or swallowing. Daughter felt that she was disoriented the patient is alert and oriented 3 now gives a coherent history. No chest pain or shortness of breath. Prediabetic but is not taking medications. Also has high cholesterol. No cardiac history. No difficulty speaking or swallowing. Dizziness is worse with standing up and better with lying down.  Patient is a 45 y.o. female presenting with altered mental status. The history is provided by the patient and a relative.  Altered Mental Status Presenting symptoms: no confusion   Associated symptoms: light-headedness, nausea and weakness   Associated symptoms: no abdominal pain, no agitation, no fever, no rash and no vomiting     Past Medical History  Diagnosis Date  . Hypercholesterolemia   . Diabetes mellitus without complication (HCC)     pre diabetic   Past Surgical History  Procedure Laterality Date  . Breast surgery Right 2012    small lump in breast removed  . Cesarean section      3 c-section   . Tubal ligation     Family History  Problem Relation Age of Onset  . Diabetes Mother   . Cancer Sister [id number]    ovarian   .  Diabetes Brother   . Diabetes Sister   . Diabetes Sister    Social History  Substance Use Topics  . Smoking status: Never Smoker   . Smokeless tobacco: Never Used  . Alcohol Use: No   OB History    No data available     Review of Systems  Constitutional: Negative for fever, activity change and appetite change.  HENT: Negative for congestion.   Respiratory: Negative for cough, chest tightness and shortness of breath.   Cardiovascular: Negative for chest pain.  Gastrointestinal: Positive for nausea. Negative for vomiting and abdominal pain.  Genitourinary: Negative for dysuria, hematuria, vaginal bleeding and vaginal discharge.  Musculoskeletal: Negative for myalgias and arthralgias.  Skin: Negative for rash.  Neurological: Positive for dizziness, weakness and light-headedness.  Psychiatric/Behavioral: Negative for confusion, self-injury and agitation.  A complete 10 system review of systems was obtained and all systems are negative except as noted in the HPI and PMH.      Allergies  Review of patient's allergies indicates no known allergies.  Home Medications   Prior to Admission medications   Medication Sig Start Date End Date Taking? Authorizing Provider  hydrOXYzine (ATARAX/VISTARIL) 25 MG tablet Take 25 mg by mouth daily as needed for anxiety or nausea.   Yes Historical Provider, MD  lovastatin (MEVACOR) 20 MG tablet Take 20 mg by mouth at bedtime.   Yes Historical Provider, MD  Omega-3 Fatty Acids (FISH OIL) 1000 MG CAPS Take 1 capsule by mouth daily.   Yes  Historical Provider, MD   BP 96/57 mmHg  Pulse 65  Temp(Src) 98.5 F (36.9 C) (Oral)  Resp 18  Ht 5\' 3"  (1.6 m)  Wt 136 lb (61.689 kg)  BMI 24.10 kg/m2  SpO2 100%  LMP 11/27/2015 Physical Exam  Constitutional: She is oriented to person, place, and time. She appears well-developed and well-nourished. No distress.  HENT:  Head: Normocephalic and atraumatic.  Mouth/Throat: Oropharynx is clear and moist. No  oropharyngeal exudate.  Moist mucus membranes  Eyes: Conjunctivae and EOM are normal. Pupils are equal, round, and reactive to light.  Neck: Normal range of motion. Neck supple.  No meningismus.  Cardiovascular: Normal rate, regular rhythm, normal heart sounds and intact distal pulses.   No murmur heard. Pulmonary/Chest: Effort normal and breath sounds normal. No respiratory distress. She exhibits no tenderness.  Abdominal: Soft. There is no tenderness. There is no rebound and no guarding.  Musculoskeletal: Normal range of motion. She exhibits no edema or tenderness.  Neurological: She is alert and oriented to person, place, and time. No cranial nerve deficit. She exhibits normal muscle tone. Coordination normal.  No ataxia on finger to nose bilaterally. No pronator drift. 5/5 strength throughout. CN 2-12 intact.Equal grip strength. Sensation intact. No nystagmus. Negative Romberg, normal gait,  Skin: Skin is warm.  Psychiatric: She has a normal mood and affect. Her behavior is normal.  Nursing note and vitals reviewed.   ED Course  Procedures (including critical care time) Labs Review Labs Reviewed  BASIC METABOLIC PANEL - Abnormal; Notable for the following:    Creatinine, Ser 0.42 (*)    All other components within normal limits  URINALYSIS, ROUTINE W REFLEX MICROSCOPIC (NOT AT [facility]) - Abnormal; Notable for the following:    Specific Gravity, Urine <1.005 (*)    All other components within normal limits  CBC WITH DIFFERENTIAL/PLATELET  TROPONIN I  PREGNANCY, URINE  CBG MONITORING, ED    Imaging Review Ct Head Wo Contrast  01/23/2016  CLINICAL DATA:  Disorientation and dizziness. EXAM: CT HEAD WITHOUT CONTRAST TECHNIQUE: Contiguous axial images were obtained from the base of the skull through the vertex without intravenous contrast. COMPARISON:  None FINDINGS: Brain: No evidence of acute infarction, hemorrhage, extra-axial collection, ventriculomegaly, or mass effect. Vascular:  No hyperdense vessel or unexpected calcification. Skull: Negative for fracture or focal lesion. Sinuses/Orbits: No acute findings. Other: None. IMPRESSION: No acute intracranial abnormality. Electronically Signed   By: Ted Mcalpine M.D.   On: 01/23/2016 19:57   I have personally reviewed and evaluated these images and lab results as part of my medical decision-making.   EKG Interpretation   Date/Time:  Friday January 23 2016 16:57:11 EDT Ventricular Rate:  71 PR Interval:  158 QRS Duration: 88 QT Interval:  410 QTC Calculation: 445 R Axis:   32 Text Interpretation:  Normal sinus rhythm Low voltage QRS Borderline ECG  No significant change was found Confirmed by Manus Gunning  MD, Lurlie Wigen [phone])  on 01/23/2016 5:39:53 PM      MDM   Final diagnoses:  Dizziness   Intermittent dizziness over the past 2 days with nausea. No chest pain or shortness of breath. No focal weakness, numbness or tingling. She is alert and oriented and denies any pain.   Neuro exam nonfocal. No ataxia. Normal gait and romberg. No nystagmus. Orthostatics obtained. IVF given  Labs reassuring. EKG nsr. CT head negative. Feels improved in the ED. Tolerating PO and ambulatory. No further dizziness. No evidence of central vertigo, ACS, or  life threatening arrhythmia.  Maintain adequate hydration and followup with PCP. Return precautions discussed.   Glynn Octave, MD 01/24/16 [phone]

## 2016-01-23 NOTE — ED Notes (Signed)
Patient denies any dizziness upon sitting or standing.  

## 2016-01-23 NOTE — ED Notes (Signed)
Daughter reports pt is pre diabetic and last night pt c/o feeling dizzy and was disoriented.  This morning pt still felt a little disoriented and dizzy and c/o dry mouth and nausea.

## 2016-01-23 NOTE — Discharge Instructions (Signed)
Dizziness °Keep yourself hydrated. Follow up with your doctor. Return to the ED if you develop new or worsening symptoms. °Dizziness is a common problem. It is a feeling of unsteadiness or light-headedness. You may feel like you are about to faint. Dizziness can lead to injury if you stumble or fall. Anyone can become dizzy, but dizziness is more common in older adults. This condition can be caused by a number of things, including medicines, dehydration, or illness. °HOME CARE INSTRUCTIONS °Taking these steps may help with your condition: °Eating and Drinking °· Drink enough fluid to keep your urine clear or pale yellow. This helps to keep you from becoming dehydrated. Try to drink more clear fluids, such as water. °· Do not drink alcohol. °· Limit your caffeine intake if directed by your health care provider. °· Limit your salt intake if directed by your health care provider. °Activity °· Avoid making quick movements. °¨ Rise slowly from chairs and steady yourself until you feel okay. °¨ In the morning, first sit up on the side of the bed. When you feel okay, stand slowly while you hold onto something until you know that your balance is fine. °· Move your legs often if you need to stand in one place for a long time. Tighten and relax your muscles in your legs while you are standing. °· Do not drive or operate heavy machinery if you feel dizzy. °· Avoid bending down if you feel dizzy. Place items in your home so that they are easy for you to reach without leaning over. °Lifestyle °· Do not use any tobacco products, including cigarettes, chewing tobacco, or electronic cigarettes. If you need help quitting, ask your health care provider. °· Try to reduce your stress level, such as with yoga or meditation. Talk with your health care provider if you need help. °General Instructions °· Watch your dizziness for any changes. °· Take medicines only as directed by your health care provider. Talk with your health care  provider if you think that your dizziness is caused by a medicine that you are taking. °· Tell a friend or a family member that you are feeling dizzy. If he or she notices any changes in your behavior, have this person call your health care provider. °· Keep all follow-up visits as directed by your health care provider. This is important. °SEEK MEDICAL CARE IF: °· Your dizziness does not go away. °· Your dizziness or light-headedness gets worse. °· You feel nauseous. °· You have reduced hearing. °· You have new symptoms. °· You are unsteady on your feet or you feel like the room is spinning. °SEEK IMMEDIATE MEDICAL CARE IF: °· You vomit or have diarrhea and are unable to eat or drink anything. °· You have problems talking, walking, swallowing, or using your arms, hands, or legs. °· You feel generally weak. °· You are not thinking clearly or you have trouble forming sentences. It may take a friend or family member to notice this. °· You have chest pain, abdominal pain, shortness of breath, or sweating. °· Your vision changes. °· You notice any bleeding. °· You have a headache. °· You have neck pain or a stiff neck. °· You have a fever. °  °This information is not intended to replace advice given to you by your health care provider. Make sure you discuss any questions you have with your health care provider. °  °Document Released: 01/19/2001 Document Revised: 12/10/2014 Document Reviewed: 07/22/2014 °Elsevier Interactive Patient Education ©2016 [employer] ° °

## 2016-05-11 ENCOUNTER — Other Ambulatory Visit (HOSPITAL_COMMUNITY): Payer: Self-pay | Admitting: *Deleted

## 2016-05-11 DIAGNOSIS — Z1231 Encounter for screening mammogram for malignant neoplasm of breast: Secondary | ICD-10-CM

## 2016-05-27 ENCOUNTER — Ambulatory Visit (HOSPITAL_COMMUNITY)
Admission: RE | Admit: 2016-05-27 | Discharge: 2016-05-27 | Disposition: A | Discharge status: Home / Self Care | Payer: PRIVATE HEALTH INSURANCE | Source: Ambulatory Visit | Attending: *Deleted | Admitting: *Deleted

## 2016-05-27 ENCOUNTER — Other Ambulatory Visit (HOSPITAL_COMMUNITY): Payer: Self-pay | Admitting: *Deleted

## 2016-05-27 ENCOUNTER — Ambulatory Visit (HOSPITAL_COMMUNITY): Payer: PRIVATE HEALTH INSURANCE

## 2016-05-27 DIAGNOSIS — Z1231 Encounter for screening mammogram for malignant neoplasm of breast: Secondary | ICD-10-CM

## 2017-05-07 ENCOUNTER — Emergency Department (HOSPITAL_COMMUNITY): Payer: No Typology Code available for payment source

## 2017-05-07 ENCOUNTER — Encounter (HOSPITAL_COMMUNITY): Payer: Self-pay | Admitting: Emergency Medicine

## 2017-05-07 ENCOUNTER — Emergency Department (HOSPITAL_COMMUNITY)
Admission: EM | Admit: 2017-05-07 | Discharge: 2017-05-07 | Disposition: A | Discharge status: Home / Self Care | Payer: No Typology Code available for payment source | Attending: Emergency Medicine | Admitting: Emergency Medicine

## 2017-05-07 DIAGNOSIS — Z79899 Other long term (current) drug therapy: Secondary | ICD-10-CM | POA: Diagnosis not present

## 2017-05-07 DIAGNOSIS — Y939 Activity, unspecified: Secondary | ICD-10-CM | POA: Diagnosis not present

## 2017-05-07 DIAGNOSIS — S199XXA Unspecified injury of neck, initial encounter: Secondary | ICD-10-CM | POA: Diagnosis present

## 2017-05-07 DIAGNOSIS — S161XXA Strain of muscle, fascia and tendon at neck level, initial encounter: Secondary | ICD-10-CM | POA: Diagnosis not present

## 2017-05-07 DIAGNOSIS — Y999 Unspecified external cause status: Secondary | ICD-10-CM | POA: Diagnosis not present

## 2017-05-07 DIAGNOSIS — S39012A Strain of muscle, fascia and tendon of lower back, initial encounter: Secondary | ICD-10-CM | POA: Diagnosis not present

## 2017-05-07 DIAGNOSIS — E119 Type 2 diabetes mellitus without complications: Secondary | ICD-10-CM | POA: Insufficient documentation

## 2017-05-07 DIAGNOSIS — Y92410 Unspecified street and highway as the place of occurrence of the external cause: Secondary | ICD-10-CM | POA: Diagnosis not present

## 2017-05-07 MED ORDER — CYCLOBENZAPRINE HCL 10 MG PO TABS
10.0000 mg | ORAL_TABLET | Freq: Once | ORAL | Status: AC
  Administered 2017-05-07: 10 mg via ORAL
  Filled 2017-05-07: qty 1

## 2017-05-07 MED ORDER — IBUPROFEN 800 MG PO TABS: 800.0000 mg | ORAL_TABLET | Freq: Three times a day (TID) | ORAL | 0 refills | Status: DC

## 2017-05-07 MED ORDER — IBUPROFEN 800 MG PO TABS
800.0000 mg | ORAL_TABLET | Freq: Once | ORAL | Status: AC
  Administered 2017-05-07: 800 mg via ORAL
  Filled 2017-05-07: qty 1

## 2017-05-07 MED ORDER — CYCLOBENZAPRINE HCL 10 MG PO TABS: 10.0000 mg | ORAL_TABLET | Freq: Three times a day (TID) | ORAL | 0 refills | Status: DC | PRN
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
## 2017-05-07 NOTE — Discharge Instructions (Signed)
Apply ice packs on and off to your neck and back.  Follow-up with your primary provider for recheck if not improving.  Return here for any worsening symptoms
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
## 2017-08-25 ENCOUNTER — Other Ambulatory Visit (HOSPITAL_COMMUNITY): Payer: Self-pay | Admitting: *Deleted

## 2017-08-25 DIAGNOSIS — Z1231 Encounter for screening mammogram for malignant neoplasm of breast: Secondary | ICD-10-CM

## 2017-09-02 ENCOUNTER — Ambulatory Visit (HOSPITAL_COMMUNITY)
Admission: RE | Admit: 2017-09-02 | Discharge: 2017-09-02 | Disposition: A | Discharge status: Home / Self Care | Payer: PRIVATE HEALTH INSURANCE | Source: Ambulatory Visit | Attending: *Deleted | Admitting: *Deleted

## 2017-09-02 DIAGNOSIS — Z1231 Encounter for screening mammogram for malignant neoplasm of breast: Secondary | ICD-10-CM | POA: Insufficient documentation

## 2017-12-27 ENCOUNTER — Emergency Department (HOSPITAL_COMMUNITY)
Admission: EM | Admit: 2017-12-27 | Discharge: 2017-12-28 | Disposition: A | Discharge status: Home / Self Care | Payer: Self-pay | Attending: Emergency Medicine | Admitting: Emergency Medicine

## 2017-12-27 ENCOUNTER — Other Ambulatory Visit: Payer: Self-pay

## 2017-12-27 ENCOUNTER — Emergency Department (HOSPITAL_COMMUNITY): Payer: Self-pay

## 2017-12-27 ENCOUNTER — Encounter (HOSPITAL_COMMUNITY): Payer: Self-pay | Admitting: Emergency Medicine

## 2017-12-27 DIAGNOSIS — R002 Palpitations: Secondary | ICD-10-CM | POA: Insufficient documentation

## 2017-12-27 DIAGNOSIS — R0602 Shortness of breath: Secondary | ICD-10-CM | POA: Insufficient documentation

## 2017-12-27 DIAGNOSIS — Z79899 Other long term (current) drug therapy: Secondary | ICD-10-CM | POA: Insufficient documentation

## 2017-12-27 DIAGNOSIS — E119 Type 2 diabetes mellitus without complications: Secondary | ICD-10-CM | POA: Insufficient documentation

## 2017-12-27 DIAGNOSIS — F419 Anxiety disorder, unspecified: Secondary | ICD-10-CM | POA: Insufficient documentation

## 2017-12-27 MED ORDER — HYDROXYZINE HCL 25 MG PO TABS: 25.0000 mg | ORAL_TABLET | Freq: Three times a day (TID) | ORAL | 0 refills | Status: DC | PRN

## 2017-12-27 NOTE — ED Triage Notes (Signed)
Pt reports she experienced a sudden onset of shortness of breath  Reports she cannot exhale well

## 2017-12-28 MED ORDER — HYDROXYZINE HCL 25 MG PO TABS
25.0000 mg | ORAL_TABLET | Freq: Once | ORAL | Status: AC
  Administered 2017-12-28: 25 mg via ORAL

## 2017-12-28 MED ORDER — HYDROXYZINE HCL 25 MG PO TABS
ORAL_TABLET | ORAL | Status: AC
  Filled 2017-12-28: qty 1

## 2017-12-28 NOTE — ED Provider Notes (Signed)
[facility] EMERGENCY DEPARTMENT Provider Note   CSN: [id number] Arrival date & time: 12/27/17  2115     History   Chief Complaint Chief Complaint  Patient presents with  . Shortness of Breath    HPI Mary [name] is a 47 y.o. female.  HPI Patient presented with acute shortness of breath.  Patient states this is an anxiety attack.  States she has had a before.  States that she felt her heart racing and some shortness of breath.  States she is feeling better now.  No cardiac history.  Denies substance abuse.  Pain was dull.  No fevers.  No swelling in her legs.  Denies possibility of pregnancy. Past Medical History:  Diagnosis Date  . Diabetes mellitus without complication (HCC)    pre diabetic  . Hypercholesterolemia     There are no active problems to display for this patient.   Past Surgical History:  Procedure Laterality Date  . BREAST SURGERY Right 2012   small lump in breast removed  . CESAREAN SECTION     3 c-section   . TUBAL LIGATION       OB History   None      Home Medications    Prior to Admission medications   Medication Sig Start Date End Date Taking? Authorizing Provider  loratadine (CLARITIN) 10 MG tablet Take 10 mg by mouth daily as needed for allergies.  11/29/17  Yes [provider]  Omega-3 Fatty Acids (FISH OIL) 1000 MG CAPS Take 1 capsule by mouth daily.   Yes [provider]  simvastatin (ZOCOR) 20 MG tablet Take 20 mg by mouth every evening.  11/29/17  Yes [provider]  Vitamin D, Ergocalciferol, (DRISDOL) 50000 units CAPS capsule Take 50,000 Units by mouth every 7 (seven) days.  11/30/17  Yes [provider]  hydrOXYzine (ATARAX/VISTARIL) 25 MG tablet Take 1 tablet (25 mg total) by mouth every 8 (eight) hours as needed for anxiety. 12/27/17   Benjiman Core, MD    Family History Family History  Problem Relation Age of Onset  . Diabetes Mother   . Cancer Sister 64       ovarian   . Diabetes  Brother   . Diabetes Sister   . Diabetes Sister     Social History Social History   Tobacco Use  . Smoking status: Never Smoker  . Smokeless tobacco: Never Used  Substance Use Topics  . Alcohol use: No  . Drug use: No     Allergies   Oxycodone   Review of Systems Review of Systems  Constitutional: Negative for appetite change and fever.  HENT: Negative for congestion.   Respiratory: Positive for shortness of breath.   Cardiovascular: Positive for palpitations.  Gastrointestinal: Negative for abdominal pain.  Genitourinary: Negative for flank pain.  Musculoskeletal: Negative for back pain.  Skin: Negative for rash.  Neurological: Negative for weakness.  Psychiatric/Behavioral: Negative for suicidal ideas. The patient is nervous/anxious.      Physical Exam Updated Vital Signs BP 101/65   Pulse (!) 59   Temp 97.9 F (36.6 C) (Oral)   Resp 14   Ht  (1.575 m)   Wt 63.5 kg (140 lb)   SpO2 96%   BMI 25.61 kg/m   Physical Exam  Constitutional: She appears well-developed and well-nourished.  HENT:  Head: Atraumatic.  Neck: Neck supple.  Cardiovascular: Regular rhythm.  Pulmonary/Chest: Effort normal and breath sounds normal. She has no wheezes. She has no rales.  Abdominal: Soft.  Musculoskeletal:       Right lower leg: She exhibits no edema.       Left lower leg: She exhibits no edema.  Neurological: She is alert.  Skin: Skin is warm. Capillary refill takes less than 2 seconds.  Psychiatric: She has a normal mood and affect.     ED Treatments / Results  Labs (all labs ordered are listed, but only abnormal results are displayed) Labs Reviewed - No data to display  EKG EKG Interpretation  Date/Time:  Tuesday Dec 27 2017 23:32:28 EDT Ventricular Rate:  64 PR Interval:    QRS Duration: 99 QT Interval:  417 QTC Calculation: 431 R Axis:   33 Text Interpretation:  Sinus rhythm Low voltage, precordial leads RSR' in V1 or V2, right VCD or RVH  Confirmed by Benjiman Core [phone]) on 12/27/2017 11:35:21 PM   Radiology Dg Chest 2 View  Result Date: 12/27/2017 CLINICAL DATA:  Shortness of breath. EXAM: CHEST - 2 VIEW COMPARISON:  8316 FINDINGS: The cardiomediastinal contours are normal. The lungs are clear. Pulmonary vasculature is normal. No consolidation, pleural effusion, or pneumothorax. No acute osseous abnormalities are seen. IMPRESSION: No acute abnormality. Electronically Signed   By: Rubye Oaks M.D.   On: 12/27/2017 23:09    Procedures Procedures (including critical care time)  Medications Ordered in ED Medications  hydrOXYzine (ATARAX/VISTARIL) tablet 25 mg (25 mg Oral Given 12/28/17 0014)     Initial Impression / Assessment and Plan / ED Course  I have reviewed the triage vital signs and the nursing notes.  Pertinent labs & imaging results that were available during my care of the patient were reviewed by me and considered in my medical decision making (see chart for details).     Patient with likely panic attack.  History of same.  X-ray and EKG reassuring.  Will discharge home.  Final Clinical Impressions(s) / ED Diagnoses   Final diagnoses:  Anxiety    ED Discharge Orders        Ordered    hydrOXYzine (ATARAX/VISTARIL) 25 MG tablet  Every 8 hours PRN     12/27/17 2348       Benjiman Core, MD 12/28/17 0020

## 2018-02-21 ENCOUNTER — Emergency Department (HOSPITAL_COMMUNITY)
Admission: EM | Admit: 2018-02-21 | Discharge: 2018-02-21 | Disposition: A | Discharge status: Home / Self Care | Payer: Self-pay | Attending: Emergency Medicine | Admitting: Emergency Medicine

## 2018-02-21 ENCOUNTER — Emergency Department (HOSPITAL_COMMUNITY): Payer: Self-pay

## 2018-02-21 ENCOUNTER — Encounter (HOSPITAL_COMMUNITY): Payer: Self-pay | Admitting: *Deleted

## 2018-02-21 ENCOUNTER — Other Ambulatory Visit: Payer: Self-pay

## 2018-02-21 ENCOUNTER — Encounter (HOSPITAL_COMMUNITY): Payer: Self-pay | Admitting: Emergency Medicine

## 2018-02-21 DIAGNOSIS — R42 Dizziness and giddiness: Secondary | ICD-10-CM | POA: Insufficient documentation

## 2018-02-21 DIAGNOSIS — Z79899 Other long term (current) drug therapy: Secondary | ICD-10-CM | POA: Insufficient documentation

## 2018-02-21 LAB — CBC WITH DIFFERENTIAL/PLATELET
BASOS PCT: 0 %
Basophils Absolute: 0 10*3/uL (ref 0.0–0.1)
Eosinophils Absolute: 0.2 10*3/uL (ref 0.0–0.7)
Eosinophils Relative: 3 %
HEMATOCRIT: 39.6 % (ref 36.0–46.0)
HEMOGLOBIN: 13.5 g/dL (ref 12.0–15.0)
LYMPHS ABS: 2.3 10*3/uL (ref 0.7–4.0)
Lymphocytes Relative: 40 %
MCH: 31.8 pg (ref 26.0–34.0)
MCHC: 34.1 g/dL (ref 30.0–36.0)
MCV: 93.2 fL (ref 78.0–100.0)
MONO ABS: 0.3 10*3/uL (ref 0.1–1.0)
MONOS PCT: 5 %
NEUTROS ABS: 3.1 10*3/uL (ref 1.7–7.7)
Neutrophils Relative %: 52 %
Platelets: 195 10*3/uL (ref 150–400)
RBC: 4.25 MIL/uL (ref 3.87–5.11)
RDW: 12.5 % (ref 11.5–15.5)
WBC: 5.9 10*3/uL (ref 4.0–10.5)

## 2018-02-21 LAB — COMPREHENSIVE METABOLIC PANEL
ALBUMIN: 3.9 g/dL (ref 3.5–5.0)
ALT: 37 U/L (ref 0–44)
ANION GAP: 6 (ref 5–15)
AST: 24 U/L (ref 15–41)
Alkaline Phosphatase: 117 U/L (ref 38–126)
BILIRUBIN TOTAL: 0.7 mg/dL (ref 0.3–1.2)
BUN: 12 mg/dL (ref 6–20)
CALCIUM: 9.4 mg/dL (ref 8.9–10.3)
CO2: 27 mmol/L (ref 22–32)
Chloride: 107 mmol/L (ref 98–111)
Creatinine, Ser: 0.44 mg/dL (ref 0.44–1.00)
GLUCOSE: 111 mg/dL — AB (ref 70–99)
Potassium: 3.9 mmol/L (ref 3.5–5.1)
Sodium: 140 mmol/L (ref 135–145)
TOTAL PROTEIN: 7.4 g/dL (ref 6.5–8.1)

## 2018-02-21 MED ORDER — PREDNISONE 10 MG PO TABS: 10.0000 mg | ORAL_TABLET | Freq: Every day | ORAL | 0 refills | Status: DC

## 2018-02-21 NOTE — ED Triage Notes (Signed)
Pt c/o dizziness, nausea and headache x 1 month. Pt reports she saw her PCP when it started and was given Loratadine and Meclizine which helped for about a week but then it came back stronger. Pt reports she went back to her PCP and was given Augmentin and Meclizine again but this time it isn't helping. Pt reports the symptoms are worse when she is moving.

## 2018-02-21 NOTE — ED Provider Notes (Signed)
[facility] EMERGENCY DEPARTMENT Provider Note   CSN: [id number] Arrival date & time: 02/21/18  [id number]     History   Chief Complaint Chief Complaint  Patient presents with  . Dizziness    HPI Mary [name] is a 47 y.o. female.  Patient describes sense of room spinning especially with positioning for 1 month intermittently.  She has been seen twice by a primary care doctor.  She was initially prescribed Claritin 10 mg and meclizine 25 mg.  On the second visit, Augmentin 875/125 was added.  No stiff neck, peripheral neurological deficits, fever, sweats, chills.  She is normally healthy.  Severity is moderate.  Position makes pain worse.     Past Medical History:  Diagnosis Date  . Anxiety   . GERD (gastroesophageal reflux disease)     There are no active problems to display for this patient.   Past Surgical History:  Procedure Laterality Date  . BREAST SURGERY     right breast biopsy 2010  . three c sections    . TONSILLECTOMY  04/21/2012   Procedure: TONSILLECTOMY;  Surgeon: Drema Halonhristopher E Newman, MD;  Location: [facility];  Service: ENT;  Laterality: N/A;  tonsillectomy     OB History   None      Home Medications    Prior to Admission medications   Medication Sig Start Date End Date Taking? Authorizing Provider  amoxicillin-clavulanate (AUGMENTIN) 875-125 MG tablet Take 1 tablet by mouth 2 (two) times daily.   Yes [provider]  loratadine-pseudoephedrine (CLARITIN-D 24-HOUR) 10-240 MG 24 hr tablet Take 1 tablet by mouth daily as needed for allergies.   Yes [provider]  LOVASTATIN PO Take 2 tablets by mouth at bedtime.   Yes [provider]  meclizine (ANTIVERT) 25 MG tablet Take 25 mg by mouth 3 (three) times daily as needed for dizziness or nausea.   Yes [provider]  Omega-3 1400 MG CAPS Take 1 capsule by mouth 2 (two) times daily.   Yes [provider]  omeprazole (PRILOSEC) 20 MG  capsule Take 20 mg by mouth daily.   Yes [provider]  predniSONE (DELTASONE) 10 MG tablet Take 1 tablet (10 mg total) by mouth daily with breakfast. 3 tablets for 3 days, 2 tablets for 3 days, 1 tablet for 3 days 02/21/18   Donnetta Hutchingook, Madoline Bhatt, MD    Family History Family History  Problem Relation Age of Onset  . Diabetes Mother        deceased  . Diabetes Sister        deceased    Social History Social History   Tobacco Use  . Smoking status: Never Smoker  . Smokeless tobacco: Never Used  Substance Use Topics  . Alcohol use: Not Currently    Frequency: Never  . Drug use: No     Allergies   Patient has no known allergies.   Review of Systems Review of Systems  All other systems reviewed and are negative.    Physical Exam Updated Vital Signs BP 96/61   Pulse 74   Temp 98 F (36.7 C) (Oral)   Resp 14   Ht 5\' 2"  (1.575 m)   Wt 67.1 kg (148 lb)   SpO2 99%   BMI 27.07 kg/m   Physical Exam  Constitutional: She is oriented to person, place, and time. She appears well-developed and well-nourished.  HENT:  Head: Normocephalic and atraumatic.  Eyes: Conjunctivae are normal.  Neck: Neck supple.  Cardiovascular: Normal rate and regular rhythm.  Pulmonary/Chest: Effort normal and breath sounds normal.  Abdominal: Soft. Bowel sounds are normal.  Musculoskeletal: Normal range of motion.  Neurological: She is alert and oriented to person, place, and time.  Symptoms reproducible with head movement.  Skin: Skin is warm and dry.  Psychiatric: She has a normal mood and affect. Her behavior is normal.  Nursing note and vitals reviewed.    ED Treatments / Results  Labs (all labs ordered are listed, but only abnormal results are displayed) Labs Reviewed  COMPREHENSIVE METABOLIC PANEL - Abnormal; Notable for the following components:      Result Value   Glucose, Bld 111 (*)    All other components within normal limits  CBC WITH DIFFERENTIAL/PLATELET     EKG None  Radiology Ct Head Wo Contrast  Result Date: 02/21/2018 CLINICAL DATA:  Dizziness with headache and nausea EXAM: CT HEAD WITHOUT CONTRAST TECHNIQUE: Contiguous axial images were obtained from the base of the skull through the vertex without intravenous contrast. COMPARISON:  None. FINDINGS: Brain: The ventricles are normal in size and configuration. There is no intracranial mass, hemorrhage, extra-axial fluid collection, or midline shift. The gray-white compartments appear normal. No evident acute infarct. Vascular: No hyperdense vessel.  No abnormal vascular calcification. Skull: The bony calvarium appears intact. Sinuses/Orbits: Mucosal thickening is noted in several ethmoid air cells. Other visualized paranasal sinuses are clear. Visualized orbits appear symmetric bilaterally. Other: Mastoid air cells are clear. IMPRESSION: There is mucosal thickening in several ethmoid air cells. Study otherwise unremarkable. Electronically Signed   By: Bretta Bang III M.D.   On: 02/21/2018 08:51    Procedures Procedures (including critical care time)  Medications Ordered in ED Medications - No data to display   Initial Impression / Assessment and Plan / ED Course  I have reviewed the triage vital signs and the nursing notes.  Pertinent labs & imaging results that were available during my care of the patient were reviewed by me and considered in my medical decision making (see chart for details).   History and physical most consistent with benign positional vertigo.  Labs and CT head negative.  Will add prednisone to her medications.  Referral to neurology. Final Clinical Impressions(s) / ED Diagnoses   Final diagnoses:  Vertigo    ED Discharge Orders        Ordered    predniSONE (DELTASONE) 10 MG tablet  Daily with breakfast     02/21/18 1024       Donnetta Hutching, MD 02/21/18 1053

## 2018-02-21 NOTE — ED Notes (Signed)
Per Dr. Adriana Simasook, patient given water.

## 2018-02-21 NOTE — ED Notes (Signed)
Patient transported to CT 

## 2018-02-21 NOTE — Discharge Instructions (Signed)
Tests showed no life-threatening condition.  Add prednisone to your medications.  Follow-up with neurologist.  Follow-up with neurologist.

## 2019-01-21 ENCOUNTER — Emergency Department (HOSPITAL_COMMUNITY): Payer: Self-pay

## 2019-01-21 ENCOUNTER — Encounter (HOSPITAL_COMMUNITY): Payer: Self-pay | Admitting: Emergency Medicine

## 2019-01-21 ENCOUNTER — Emergency Department (HOSPITAL_COMMUNITY)
Admission: EM | Admit: 2019-01-21 | Discharge: 2019-01-21 | Disposition: A | Discharge status: Home / Self Care | Payer: Self-pay | Attending: Emergency Medicine | Admitting: Emergency Medicine

## 2019-01-21 ENCOUNTER — Other Ambulatory Visit: Payer: Self-pay

## 2019-01-21 DIAGNOSIS — E119 Type 2 diabetes mellitus without complications: Secondary | ICD-10-CM | POA: Insufficient documentation

## 2019-01-21 DIAGNOSIS — Y92009 Unspecified place in unspecified non-institutional (private) residence as the place of occurrence of the external cause: Secondary | ICD-10-CM | POA: Insufficient documentation

## 2019-01-21 DIAGNOSIS — N12 Tubulo-interstitial nephritis, not specified as acute or chronic: Secondary | ICD-10-CM

## 2019-01-21 DIAGNOSIS — S0990XA Unspecified injury of head, initial encounter: Secondary | ICD-10-CM | POA: Insufficient documentation

## 2019-01-21 DIAGNOSIS — W208XXA Other cause of strike by thrown, projected or falling object, initial encounter: Secondary | ICD-10-CM | POA: Insufficient documentation

## 2019-01-21 DIAGNOSIS — Y999 Unspecified external cause status: Secondary | ICD-10-CM | POA: Insufficient documentation

## 2019-01-21 DIAGNOSIS — Y9389 Activity, other specified: Secondary | ICD-10-CM | POA: Insufficient documentation

## 2019-01-21 DIAGNOSIS — Z79899 Other long term (current) drug therapy: Secondary | ICD-10-CM | POA: Insufficient documentation

## 2019-01-21 DIAGNOSIS — R109 Unspecified abdominal pain: Secondary | ICD-10-CM | POA: Insufficient documentation

## 2019-01-21 DIAGNOSIS — S060X0A Concussion without loss of consciousness, initial encounter: Secondary | ICD-10-CM

## 2019-01-21 LAB — CBC WITH DIFFERENTIAL/PLATELET
Abs Immature Granulocytes: 0.04 10*3/uL (ref 0.00–0.07)
Basophils Absolute: 0 10*3/uL (ref 0.0–0.1)
Basophils Relative: 0 %
Eosinophils Absolute: 0 10*3/uL (ref 0.0–0.5)
Eosinophils Relative: 0 %
HCT: 37.2 % (ref 36.0–46.0)
Hemoglobin: 12.5 g/dL (ref 12.0–15.0)
Immature Granulocytes: 0 %
Lymphocytes Relative: 7 %
Lymphs Abs: 0.7 10*3/uL (ref 0.7–4.0)
MCH: 31.4 pg (ref 26.0–34.0)
MCHC: 33.6 g/dL (ref 30.0–36.0)
MCV: 93.5 fL (ref 80.0–100.0)
Monocytes Absolute: 0.5 10*3/uL (ref 0.1–1.0)
Monocytes Relative: 5 %
Neutro Abs: 8.7 10*3/uL — ABNORMAL HIGH (ref 1.7–7.7)
Neutrophils Relative %: 88 %
Platelets: 145 10*3/uL — ABNORMAL LOW (ref 150–400)
RBC: 3.98 MIL/uL (ref 3.87–5.11)
RDW: 12.4 % (ref 11.5–15.5)
WBC: 10 10*3/uL (ref 4.0–10.5)
nRBC: 0 % (ref 0.0–0.2)

## 2019-01-21 LAB — LACTIC ACID, PLASMA: Lactic Acid, Venous: 0.8 mmol/L (ref 0.5–1.9)

## 2019-01-21 LAB — BASIC METABOLIC PANEL
Anion gap: 13 (ref 5–15)
BUN: 8 mg/dL (ref 6–20)
CO2: 24 mmol/L (ref 22–32)
Calcium: 9.1 mg/dL (ref 8.9–10.3)
Chloride: 99 mmol/L (ref 98–111)
Creatinine, Ser: 0.54 mg/dL (ref 0.44–1.00)
GFR calc Af Amer: >60 mL/min (ref >60)
GFR calc non Af Amer: >60 mL/min (ref >60)
Glucose, Bld: 123 mg/dL — ABNORMAL HIGH (ref 70–99)
Potassium: 3.4 mmol/L — ABNORMAL LOW (ref 3.5–5.1)
Sodium: 136 mmol/L (ref 135–145)

## 2019-01-21 LAB — URINALYSIS, ROUTINE W REFLEX MICROSCOPIC
Bilirubin Urine: NEGATIVE
Glucose, UA: NEGATIVE mg/dL
Ketones, ur: 5 mg/dL — AB
Nitrite: NEGATIVE
Protein, ur: 100 mg/dL — AB
RBC / HPF: >50 RBC/hpf — ABNORMAL HIGH (ref 0–5)
Specific Gravity, Urine: 1.016 (ref 1.005–1.030)
Trans Epithel, UA: 1
pH: 7 (ref 5.0–8.0)

## 2019-01-21 MED ORDER — SODIUM CHLORIDE 0.9 % IV BOLUS
1000.0000 mL | Freq: Once | INTRAVENOUS | Status: AC
  Administered 2019-01-21: 12:00:00 1000 mL via INTRAVENOUS

## 2019-01-21 MED ORDER — SODIUM CHLORIDE 0.9 % IV BOLUS
1000.0000 mL | Freq: Once | INTRAVENOUS | Status: AC
  Administered 2019-01-21: 13:00:00 1000 mL via INTRAVENOUS

## 2019-01-21 MED ORDER — NAPROXEN 250 MG PO TABS
500.0000 mg | ORAL_TABLET | Freq: Once | ORAL | Status: AC
  Administered 2019-01-21: 09:00:00 500 mg via ORAL
  Filled 2019-01-21: qty 2

## 2019-01-21 MED ORDER — CEPHALEXIN 500 MG PO CAPS: 500.0000 mg | ORAL_CAPSULE | Freq: Two times a day (BID) | ORAL | 0 refills | Status: DC

## 2019-01-21 MED ORDER — SODIUM CHLORIDE 0.9 % IV BOLUS
1000.0000 mL | Freq: Once | INTRAVENOUS | Status: AC
  Administered 2019-01-21: 1000 mL via INTRAVENOUS

## 2019-01-21 MED ORDER — SODIUM CHLORIDE 0.9 % IV SOLN
1.0000 g | Freq: Once | INTRAVENOUS | Status: AC
  Administered 2019-01-21: 1 g via INTRAVENOUS
  Filled 2019-01-21: qty 10

## 2019-01-21 MED ORDER — ACETAMINOPHEN 500 MG PO TABS
1000.0000 mg | ORAL_TABLET | Freq: Once | ORAL | Status: AC
  Administered 2019-01-21: 1000 mg via ORAL
  Filled 2019-01-21: qty 2

## 2019-01-21 MED ORDER — IBUPROFEN 400 MG PO TABS: 400.0000 mg | ORAL_TABLET | Freq: Four times a day (QID) | ORAL | 0 refills | Status: DC | PRN

## 2019-01-21 NOTE — ED Provider Notes (Signed)
[facility] Provider Note   CSN: [id number] Arrival date & time: 01/21/19  [id number]    History   Chief Complaint Chief Complaint  Patient presents with  . Headache    HPI Mary [name] is a 48 y.o. female.     HPI  The patient is a 48 year old female, she has a history of prediabetes anxiety and hypercholesterolemia.  She presents to the hospital today with a couple of complaints, she reports that she has had some burning and stinging with urination in the morning, she has had some lower back pain but did not start having lower back pain until she was struck in the head while she was working.  She was cleaning a house when she was struck quite hard on the top of the head when she hit a cabinet.  She denies losing consciousness but has had some intermittent pain since that time which seems to be throbbing, seems to get better with medications but continues to come back.  There is no changes in vision, no chest pain or shortness of breath, no numbness or weakness, no fevers or chills, no diarrhea.  She denies any swelling of her legs, her gait has been normal, her coordination has been normal, she wanted to get checked out today because of the ongoing headaches.  She has been taking ibuprofen and Tylenol and get some temporary relief with that.  Because the patient started having lower back pain last night she thought she might have a kidney stone, she did some research on the Internet and found that if she soaked in a given lemon juice overnight and then took the egg out in the morning and drink the lemon juice that this might help.  She did that this morning and afterwards felt sick and vomited and still has the pain though it is mild in the lower back.  Past Medical History:  Diagnosis Date  . Anxiety   . Diabetes mellitus without complication (HCC)    pre diabetic  . GERD (gastroesophageal reflux disease)   . Hypercholesterolemia     There are  no active problems to display for this patient.   Past Surgical History:  Procedure Laterality Date  . BREAST SURGERY Right 2012   small lump in breast removed  . BREAST SURGERY     right breast biopsy 2010  . CESAREAN SECTION     3 c-section   . three c sections    . TONSILLECTOMY  04/21/2012   Procedure: TONSILLECTOMY;  Surgeon: Drema Halonhristopher E Newman, MD;  Location: [facility];  Service: ENT;  Laterality: N/A;  tonsillectomy  . TUBAL LIGATION       OB History   No obstetric history on file.      Home Medications    Prior to Admission medications   Medication Sig Start Date End Date Taking? Authorizing Provider  lovastatin (MEVACOR) 20 MG tablet Take 40 mg by mouth at bedtime. 01/09/19  Yes [provider]  Omega-3 Fatty Acids (FISH OIL) 1000 MG CAPS Take 1 capsule by mouth daily.   Yes [provider]  cephALEXin (KEFLEX) 500 MG capsule Take 1 capsule (500 mg total) by mouth 2 (two) times daily for 10 days. 01/21/19 01/31/19  Eber HongMiller, Jadarious Dobbins, MD  ibuprofen (ADVIL) 400 MG tablet Take 1 tablet (400 mg total) by mouth every 6 (six) hours as needed. 01/21/19   Eber HongMiller, Artis Buechele, MD    Family History Family History  Problem Relation Age  of Onset  . Diabetes Mother        deceased  . Diabetes Sister        deceased  . Cancer Sister [id number]       ovarian   . Diabetes Brother   . Diabetes Sister   . Diabetes Sister     Social History Social History   Tobacco Use  . Smoking status: Never Smoker  . Smokeless tobacco: Never Used  Substance Use Topics  . Alcohol use: Not Currently    Frequency: Never  . Drug use: No     Allergies   Oxycodone   Review of Systems Review of Systems  All other systems reviewed and are negative.    Physical Exam Updated Vital Signs BP (!) 89/52 (BP Location: Left Arm)   Pulse (!) 102   Temp 98.5 F (36.9 C) (Oral)   Resp 16   Ht 1.575 m (5\' 2" )   Wt 63 kg   SpO2 96%   BMI 25.42 kg/m   Physical Exam  Vitals signs and nursing note reviewed.  Constitutional:      General: She is not in acute distress.    Appearance: She is well-developed.  HENT:     Head: Normocephalic and atraumatic.     Comments: No signs of head trauma, normal appearing forehead / scalp / cranium    Mouth/Throat:     Pharynx: No oropharyngeal exudate.  Eyes:     General: No scleral icterus.       Right eye: No discharge.        Left eye: No discharge.     Conjunctiva/sclera: Conjunctivae normal.     Pupils: Pupils are equal, round, and reactive to light.  Neck:     Musculoskeletal: Normal range of motion and neck supple.     Thyroid: No thyromegaly.     Vascular: No JVD.  Cardiovascular:     Rate and Rhythm: Normal rate and regular rhythm.     Heart sounds: Normal heart sounds. No murmur. No friction rub. No gallop.   Pulmonary:     Effort: Pulmonary effort is normal. No respiratory distress.     Breath sounds: Normal breath sounds. No wheezing or rales.  Abdominal:     General: Bowel sounds are normal. There is no distension.     Palpations: Abdomen is soft. There is no mass.     Tenderness: There is no abdominal tenderness.     Comments: Soft nontender abdomen  Musculoskeletal: Normal range of motion.        General: No tenderness.  Lymphadenopathy:     Cervical: No cervical adenopathy.  Skin:    General: Skin is warm and dry.     Findings: No erythema or rash.  Neurological:     Mental Status: She is alert.     Coordination: Coordination normal.     Comments: Speech is clear, cranial nerves III through XII are intact, memory is intact, strength is normal in all 4 extremities including grips, sensation is intact to light touch and pinprick in all 4 extremities. Coordination as tested by finger-nose-finger is normal, no limb ataxia. Normal gait, normal reflexes at the patellar tendons bilaterally  Psychiatric:        Behavior: Behavior normal.      ED Treatments / Results  Labs (all labs  ordered are listed, but only abnormal results are displayed) Labs Reviewed  URINALYSIS, ROUTINE W REFLEX MICROSCOPIC - Abnormal; Notable for the following components:  Result Value   Color, Urine AMBER (*)    APPearance HAZY (*)    Hgb urine dipstick MODERATE (*)    Ketones, ur 5 (*)    Protein, ur 100 (*)    Leukocytes,Ua MODERATE (*)    RBC / HPF >50 (*)    Bacteria, UA MANY (*)    All other components within normal limits  BASIC METABOLIC PANEL - Abnormal; Notable for the following components:   Potassium 3.4 (*)    Glucose, Bld 123 (*)    All other components within normal limits  CBC WITH DIFFERENTIAL/PLATELET - Abnormal; Notable for the following components:   Platelets 145 (*)    Neutro Abs 8.7 (*)    All other components within normal limits  CULTURE, BLOOD (ROUTINE X 2)  CULTURE, BLOOD (ROUTINE X 2)  URINE CULTURE  LACTIC ACID, PLASMA    EKG None  Radiology Ct Head Wo Contrast  Result Date: 01/21/2019 CLINICAL DATA:  Headaches since hitting head on a wall 3 weeks ago while cleaning. EXAM: CT HEAD WITHOUT CONTRAST TECHNIQUE: Contiguous axial images were obtained from the base of the skull through the vertex without intravenous contrast. COMPARISON:  01/23/2016 FINDINGS: Brain: There is no evidence of acute infarct, intracranial hemorrhage, mass, midline shift, or extra-axial fluid collection. The ventricles and sulci are normal. Vascular: No hyperdense vessel. Skull: No fracture or focal osseous lesion. Sinuses/Orbits: Visualized paranasal sinuses are clear. There may be trace mastoid fluid bilaterally. The orbits are unremarkable. Other: None. IMPRESSION: Negative head CT. Electronically Signed   By: Logan Bores M.D.   On: 01/21/2019 09:51   Ct Renal Stone Study  Result Date: 01/21/2019 CLINICAL DATA:  Bilateral flank pain EXAM: CT ABDOMEN AND PELVIS WITHOUT CONTRAST TECHNIQUE: Multidetector CT imaging of the abdomen and pelvis was performed following the standard  protocol without IV contrast. COMPARISON:  03/12/2015 FINDINGS: Lower chest: Minor dependent basilar atelectasis. Stable calcified right lower lobe granuloma measuring 5 mm, image 32 series 4. No lower lobe pneumonia. Normal heart size. No pericardial pleural effusion. Hepatobiliary: No focal liver abnormality is seen. No gallstones, gallbladder wall thickening, or biliary dilatation. Pancreas: Unremarkable. No pancreatic ductal dilatation or surrounding inflammatory changes. Spleen: Normal in size without focal abnormality. Adrenals/Urinary Tract: Normal adrenal glands. Left kidney and ureter demonstrate minor perinephric and periureteral strandy edema proximally. Slight pelviectasis of the left kidney. Left ureter does not demonstrate any radiopaque obstructing calculus. No UVJ abnormality. Appearance compatible with recent stone passage versus ascending urinary tract infection or pyelonephritis. Correlate with urinalysis. Right kidney and ureter are unremarkable without acute process or obstruction. Bladder is underdistended. Stomach/Bowel: Negative for bowel obstruction, significant dilatation, ileus, or free air. Normal appendix identified. No free fluid, fluid collection, hemorrhage, hematoma, abscess, or ascites. Vascular/Lymphatic: Negative for aneurysm. No significant atherosclerosis. Retroaortic left renal vein noted, normal variant. No adenopathy. Reproductive: Uterus and bilateral adnexa are unremarkable. Other: No abdominal wall hernia or abnormality. No abdominopelvic ascites. Musculoskeletal: No acute osseous finding. IMPRESSION: Mild left perinephric and proximal periureteral strandy edema/inflammation without associated radiopaque obstructing stone identified. Suspect recent stone passage versus ascending urinary tract infection or pyelonephritis. No other acute intra-abdominopelvic finding by noncontrast CT. Electronically Signed   By: Jerilynn Mages.  Shick M.D.   On: 01/21/2019 10:34    Procedures  Procedures (including critical care time)  Medications Ordered in ED Medications  naproxen (NAPROSYN) tablet 500 mg (500 mg Oral Given 01/21/19 0851)  cefTRIAXone (ROCEPHIN) 1 g in sodium chloride 0.9 % 100 mL  IVPB (0 g Intravenous Stopped 01/21/19 1205)  sodium chloride 0.9 % bolus 1,000 mL (0 mLs Intravenous Stopped 01/21/19 1308)  sodium chloride 0.9 % bolus 1,000 mL (0 mLs Intravenous Stopped 01/21/19 1415)  sodium chloride 0.9 % bolus 1,000 mL (0 mLs Intravenous Stopped 01/21/19 1308)  acetaminophen (TYLENOL) tablet 1,000 mg (1,000 mg Oral Given 01/21/19 1124)     Initial Impression / Assessment and Plan / ED Course  I have reviewed the triage vital signs and the nursing notes.  Pertinent labs & imaging results that were available during my care of the patient were reviewed by me and considered in my medical decision making (see chart for details).  Clinical Course as of Jan 21 1447  Sun Jan 21, 2019  0949 Urinalysis reveals many bacteria, 11-20 white blood cells and greater than 50 red blood cells.  Given her flank pain this may be more related to a pyelonephritis, will reevaluate with CT scan to make sure there is no kidney stone, check renal and blood counts.   [BM]    Clinical Course User Index [BM] Eber HongMiller, Sherrel Ploch, MD       The patient now reports that she has [facility] for her dysuria and was initially told that she had no signs of a urinary infection.  Will recheck urine, ongoing headaches 3 weeks after having head injury, CT scan otherwise may just be concussion.  Patient agreeable  I have reviewed the patient's labs, her vital signs, I have witnessed her walk back and forth to the bathroom without any difficulties and I have looked at the medical record regarding her old vital signs on prior visits and she tends to have a systolic blood pressure that ranges between 95 and 105 systolic.  Given that she has orthostatic vital signs here with no significant  tachycardia, she is not dropping her blood pressure when she stands and she has a lactic acid of 0.8 with no leukocytosis or renal dysfunction I doubt that her blood pressure recommends a sepsis related hypotension.  She feels well, she has been given IV fluids and antibiotics and is stable for discharge, ice at the bedside with the nurse explained the indications for return and the patient was able to teach back the reasons to come back.  Final Clinical Impressions(s) / ED Diagnoses   Final diagnoses:  Pyelonephritis  Concussion without loss of consciousness, initial encounter    ED Discharge Orders         Ordered    ibuprofen (ADVIL) 400 MG tablet  Every 6 hours PRN     01/21/19 1446    cephALEXin (KEFLEX) 500 MG capsule  2 times daily     01/21/19 1446           Eber HongMiller, Omolola Mittman, MD 01/21/19 1448

## 2019-01-21 NOTE — Discharge Instructions (Signed)
Your testing shows no signs of brain injury however you do have a concussion, this may cause headaches for several weeks or even longer Ibuprofen, 400 mg daily every 8 hours as needed for headache You do have a urinary infection which may have gotten to your kidney which is causing some lower back pain. Please take cephalexin, 500 mg twice a day for 10 days Please come back to the emergency department for increasing pain, fever, vomiting or any severe or worsening symptoms.  Otherwise see your doctor within 48 hours for a recheck.  Thank you for letting us take care of you today!  Please obtain all of your results from medical records or have your doctors office obtain the results - share them with your doctor - you should be seen at your doctors office in the next 2 days. Call today to arrange your follow up. Take the medications as prescribed. Please review all of the medicines and only take them if you do not have an allergy to them. Please be aware that if you are taking birth control pills, taking other prescriptions, ESPECIALLY ANTIBIOTICS may make the birth control ineffective - if this is the case, either do not engage in sexual activity or use alternative methods of birth control such as condoms until you have finished the medicine and your family doctor says it is OK to restart them. If you are on a blood thinner such as COUMADIN, be aware that any other medicine that you take may cause the coumadin to either work too much, or not enough - you should have your coumadin level rechecked in next 7 days if this is the case.  ?  It is also a possibility that you have an allergic reaction to any of the medicines that you have been prescribed - Everybody reacts differently to medications and while MOST people have no trouble with most medicines, you may have a reaction such as nausea, vomiting, rash, swelling, shortness of breath. If this is the case, please stop taking the medicine immediately and  contact your physician.   If you were given a medication in the ED such as percocet, vicodin, or morphine, be aware that these medicines are sedating and may change your ability to take care of yourself adequately for several hours after being given this medicines - you should not drive or take care of small children if you were given this medicine in the Emergency Department or if you have been prescribed these types of medicines. ?   You should return to the ER IMMEDIATELY if you develop severe or worsening symptoms.

## 2019-01-21 NOTE — ED Notes (Signed)
PT ambulated independently to bathroom at this time with no acute distress noted.

## 2019-01-21 NOTE — ED Notes (Signed)
Pt trial done and pt tolerated well. States she feels much better at this time.

## 2019-01-21 NOTE — ED Notes (Signed)
Patient transported to CT 

## 2019-01-21 NOTE — ED Triage Notes (Signed)
Pt has numerous complaints this morning. Her main complaint is a headache from hitting her head on the wall 3 weeks ago while cleaning.

## 2019-01-22 ENCOUNTER — Encounter (HOSPITAL_COMMUNITY): Payer: Self-pay

## 2019-01-22 ENCOUNTER — Other Ambulatory Visit: Payer: Self-pay

## 2019-01-22 ENCOUNTER — Inpatient Hospital Stay (HOSPITAL_COMMUNITY)
Admission: EM | Admit: 2019-01-22 | Discharge: 2019-01-24 | DRG: 690 | Disposition: A | Discharge status: Home / Self Care | Payer: Self-pay | Attending: Internal Medicine | Admitting: Internal Medicine

## 2019-01-22 ENCOUNTER — Telehealth (HOSPITAL_BASED_OUTPATIENT_CLINIC_OR_DEPARTMENT_OTHER): Payer: Self-pay | Admitting: *Deleted

## 2019-01-22 DIAGNOSIS — R7881 Bacteremia: Secondary | ICD-10-CM | POA: Diagnosis present

## 2019-01-22 DIAGNOSIS — K219 Gastro-esophageal reflux disease without esophagitis: Secondary | ICD-10-CM | POA: Diagnosis present

## 2019-01-22 DIAGNOSIS — E876 Hypokalemia: Secondary | ICD-10-CM | POA: Diagnosis present

## 2019-01-22 DIAGNOSIS — R7989 Other specified abnormal findings of blood chemistry: Secondary | ICD-10-CM | POA: Diagnosis present

## 2019-01-22 DIAGNOSIS — F0781 Postconcussional syndrome: Secondary | ICD-10-CM

## 2019-01-22 DIAGNOSIS — R739 Hyperglycemia, unspecified: Secondary | ICD-10-CM | POA: Diagnosis present

## 2019-01-22 DIAGNOSIS — R7401 Elevation of levels of liver transaminase levels: Secondary | ICD-10-CM | POA: Diagnosis present

## 2019-01-22 DIAGNOSIS — B9620 Unspecified Escherichia coli [E. coli] as the cause of diseases classified elsewhere: Secondary | ICD-10-CM | POA: Diagnosis present

## 2019-01-22 DIAGNOSIS — Z20828 Contact with and (suspected) exposure to other viral communicable diseases: Secondary | ICD-10-CM | POA: Diagnosis present

## 2019-01-22 DIAGNOSIS — E7800 Pure hypercholesterolemia, unspecified: Secondary | ICD-10-CM | POA: Diagnosis present

## 2019-01-22 DIAGNOSIS — G43909 Migraine, unspecified, not intractable, without status migrainosus: Secondary | ICD-10-CM | POA: Diagnosis present

## 2019-01-22 DIAGNOSIS — K7581 Nonalcoholic steatohepatitis (NASH): Secondary | ICD-10-CM | POA: Diagnosis present

## 2019-01-22 DIAGNOSIS — Z833 Family history of diabetes mellitus: Secondary | ICD-10-CM

## 2019-01-22 DIAGNOSIS — N10 Acute tubulo-interstitial nephritis: Principal | ICD-10-CM | POA: Diagnosis present

## 2019-01-22 DIAGNOSIS — E119 Type 2 diabetes mellitus without complications: Secondary | ICD-10-CM

## 2019-01-22 LAB — COMPREHENSIVE METABOLIC PANEL
ALT: 73 U/L — ABNORMAL HIGH (ref 0–44)
AST: 56 U/L — ABNORMAL HIGH (ref 15–41)
Albumin: 3.8 g/dL (ref 3.5–5.0)
Alkaline Phosphatase: 158 U/L — ABNORMAL HIGH (ref 38–126)
Anion gap: 11 (ref 5–15)
BUN: 8 mg/dL (ref 6–20)
CO2: 24 mmol/L (ref 22–32)
Calcium: 9.1 mg/dL (ref 8.9–10.3)
Chloride: 105 mmol/L (ref 98–111)
Creatinine, Ser: 0.51 mg/dL (ref 0.44–1.00)
GFR calc Af Amer: >60 mL/min (ref >60)
GFR calc non Af Amer: >60 mL/min (ref >60)
Glucose, Bld: 124 mg/dL — ABNORMAL HIGH (ref 70–99)
Potassium: 3 mmol/L — ABNORMAL LOW (ref 3.5–5.1)
Sodium: 140 mmol/L (ref 135–145)
Total Bilirubin: 0.8 mg/dL (ref 0.3–1.2)
Total Protein: 7.5 g/dL (ref 6.5–8.1)

## 2019-01-22 LAB — BLOOD CULTURE ID PANEL (REFLEXED)

## 2019-01-22 LAB — CBC WITH DIFFERENTIAL/PLATELET
Abs Immature Granulocytes: 0.03 10*3/uL (ref 0.00–0.07)
Basophils Absolute: 0 10*3/uL (ref 0.0–0.1)
Basophils Relative: 0 %
Eosinophils Absolute: 0 10*3/uL (ref 0.0–0.5)
Eosinophils Relative: 0 %
HCT: 36.9 % (ref 36.0–46.0)
Hemoglobin: 12.3 g/dL (ref 12.0–15.0)
Immature Granulocytes: 0 %
Lymphocytes Relative: 12 %
Lymphs Abs: 1 10*3/uL (ref 0.7–4.0)
MCH: 31.1 pg (ref 26.0–34.0)
MCHC: 33.3 g/dL (ref 30.0–36.0)
MCV: 93.2 fL (ref 80.0–100.0)
Monocytes Absolute: 0.6 10*3/uL (ref 0.1–1.0)
Monocytes Relative: 6 %
Neutro Abs: 6.9 10*3/uL (ref 1.7–7.7)
Neutrophils Relative %: 82 %
Platelets: 151 10*3/uL (ref 150–400)
RBC: 3.96 MIL/uL (ref 3.87–5.11)
RDW: 13 % (ref 11.5–15.5)
WBC: 8.6 10*3/uL (ref 4.0–10.5)
nRBC: 0 % (ref 0.0–0.2)

## 2019-01-22 LAB — URINALYSIS, ROUTINE W REFLEX MICROSCOPIC
Bilirubin Urine: NEGATIVE
Glucose, UA: NEGATIVE mg/dL
Ketones, ur: 20 mg/dL — AB
Leukocytes,Ua: NEGATIVE
Nitrite: NEGATIVE
Protein, ur: NEGATIVE mg/dL
Specific Gravity, Urine: 1.005 (ref 1.005–1.030)
pH: 7 (ref 5.0–8.0)

## 2019-01-22 LAB — PHOSPHORUS: Phosphorus: 1.9 mg/dL — ABNORMAL LOW (ref 2.5–4.6)

## 2019-01-22 LAB — LACTIC ACID, PLASMA: Lactic Acid, Venous: 0.9 mmol/L (ref 0.5–1.9)

## 2019-01-22 LAB — MAGNESIUM: Magnesium: 2 mg/dL (ref 1.7–2.4)

## 2019-01-22 MED ORDER — POTASSIUM CHLORIDE IN NACL 40-0.9 MEQ/L-% IV SOLN
INTRAVENOUS | Status: AC
  Administered 2019-01-23: 125 mL/h via INTRAVENOUS
  Filled 2019-01-22 (×2): qty 1000

## 2019-01-22 MED ORDER — K PHOS MONO-SOD PHOS DI & MONO 155-852-130 MG PO TABS
500.0000 mg | ORAL_TABLET | Freq: Once | ORAL | Status: AC
  Administered 2019-01-23: 500 mg via ORAL
  Filled 2019-01-22 (×2): qty 2

## 2019-01-22 MED ORDER — SODIUM CHLORIDE 0.9 % IV SOLN
2.0000 g | INTRAVENOUS | Status: DC
  Administered 2019-01-23: 2 g via INTRAVENOUS
  Filled 2019-01-22 (×2): qty 20

## 2019-01-22 MED ORDER — POTASSIUM CHLORIDE IN NACL 40-0.9 MEQ/L-% IV SOLN
INTRAVENOUS | Status: AC
  Administered 2019-01-22: 250 mL/h via INTRAVENOUS
  Filled 2019-01-22 (×2): qty 1000

## 2019-01-22 MED ORDER — MAGNESIUM SULFATE 2 GM/50ML IV SOLN
2.0000 g | Freq: Once | INTRAVENOUS | Status: AC
  Administered 2019-01-22: 23:00:00 2 g via INTRAVENOUS
  Filled 2019-01-22: qty 50

## 2019-01-22 MED ORDER — SODIUM CHLORIDE 0.9 % IV SOLN
1.0000 g | Freq: Once | INTRAVENOUS | Status: AC
  Administered 2019-01-22: 1 g via INTRAVENOUS
  Filled 2019-01-22: qty 10

## 2019-01-22 MED ORDER — ACETAMINOPHEN 650 MG RE SUPP: 650.0000 mg | Freq: Four times a day (QID) | RECTAL | Status: DC | PRN

## 2019-01-22 MED ORDER — IBUPROFEN 400 MG PO TABS
400.0000 mg | ORAL_TABLET | Freq: Four times a day (QID) | ORAL | Status: DC | PRN
  Administered 2019-01-23: 400 mg via ORAL
  Filled 2019-01-22: qty 1

## 2019-01-22 MED ORDER — PRAVASTATIN SODIUM 40 MG PO TABS
40.0000 mg | ORAL_TABLET | Freq: Every day | ORAL | Status: DC
  Filled 2019-01-22: qty 1

## 2019-01-22 MED ORDER — PROCHLORPERAZINE EDISYLATE 10 MG/2ML IJ SOLN: 5.0000 mg | INTRAMUSCULAR | Status: DC | PRN

## 2019-01-22 MED ORDER — ENOXAPARIN SODIUM 40 MG/0.4ML ~~LOC~~ SOLN
40.0000 mg | SUBCUTANEOUS | Status: DC
  Administered 2019-01-23 (×2): 40 mg via SUBCUTANEOUS
  Filled 2019-01-22 (×2): qty 0.4

## 2019-01-22 MED ORDER — ACETAMINOPHEN 325 MG PO TABS
650.0000 mg | ORAL_TABLET | Freq: Four times a day (QID) | ORAL | Status: DC | PRN
  Administered 2019-01-23 – 2019-01-24 (×3): 650 mg via ORAL
  Filled 2019-01-22 (×3): qty 2

## 2019-01-22 MED ORDER — KETOROLAC TROMETHAMINE 30 MG/ML IJ SOLN
30.0000 mg | Freq: Once | INTRAMUSCULAR | Status: AC
  Administered 2019-01-22: 30 mg via INTRAVENOUS
  Filled 2019-01-22: qty 1

## 2019-01-22 NOTE — H&P (Signed)
History and Physical    Mary [name] POE:[id number] DOB: [birth year] DOA: 01/22/2019  PCP: [facility] [place]   Patient coming from: Home.  I have personally briefly reviewed patient's old medical records in [place]  Chief Complaint: Headache and flank pain.  HPI: Mary [name] is a 48 y.o. female with medical history significant of anxiety, type 2 diabetes, GERD, hyperlipidemia who was seen yesterday in the emergency department due to UTI and returns today after blood culture reveal gram-negative bacteria.  She complains of urgency, suprapubic and bilateral flank tenderness more intense on the left.  She denies fever, but complains of chills, mild fatigue and malaise.  Mild decrease in appetite, but denies sore throat, rhinorrhea, dyspnea, chest pain, palpitations, dizziness, lower extremity edema, nausea, vomiting, diarrhea, melena or hematochezia.  Denies polyuria, polydipsia, polyphagia or blurred vision.  She also complains of a left frontal headache that she developed after hitting the area about 3 weeks ago.  There was no vomiting, blurred vision or somnolence following this.  ED Course: Vital signs were normal.  The patient was given fluids and ceftriaxone in the emergency department.  Her urinalysis showed large hemoglobinuria, rare bacteria, ketonuria 20 mg/dL.  CBC with than expected values.  CMP shows a potassium of 3.0 mmol/L.  Glucose at 224 mg/dL.  AST 56, ALT 73 and alkaline phosphatase 158 units/L.  All other values including renal function and bilirubin within normal parameters.  Lactic acid was normal.  Phosphorus 1.9 and magnesium 2.0 mg/dL.  Imaging: CT head was negative.  CT renal protocol showed mild renal perinephric and proximal periureteral stranding with edema inflammation without associated radiopaque obstructing stone.  Suspicion for recent stone passage versus ascending UTI or pyelonephritis.  No other acute  finding on noncontrast abdomen CT.  Please see images and full radiology report for further detail.  Review of Systems: As per HPI otherwise 10 point review of systems negative.   Past Medical History:  Diagnosis Date   Anxiety    Diabetes mellitus without complication ([place])    pre diabetic   GERD (gastroesophageal reflux disease)    Hypercholesterolemia     Past Surgical History:  Procedure Laterality Date   BREAST SURGERY Right 2012   small lump in breast removed   BREAST SURGERY     right breast biopsy 2010   CESAREAN SECTION     3 c-section    three c sections     TONSILLECTOMY  04/21/2012   Procedure: TONSILLECTOMY;  Surgeon: Rozetta Nunnery, MD;  Location[facility];  Service: ENT;  Laterality: N/A;  tonsillectomy   TUBAL LIGATION       reports that she has never smoked. She has never used smokeless tobacco. She reports previous alcohol use. She reports that she does not use drugs.  Allergies  Allergen Reactions   Oxycodone Nausea Only    Family History  Problem Relation Age of Onset   Diabetes Mother        deceased   Diabetes Sister        deceased   Cancer Sister 75       ovarian    Diabetes Brother    Diabetes Sister    Diabetes Sister    Prior to Admission medications   Medication Sig Start Date End Date Taking? Authorizing Provider  cephALEXin (KEFLEX) 500 MG capsule Take 1 capsule (500 mg total) by mouth 2 (two) times daily for 10 days. 01/21/19 01/31/19 Yes Sabra Heck,  Arlys JohnBrian, MD  ibuprofen (ADVIL) 400 MG tablet Take 1 tablet (400 mg total) by mouth every 6 (six) hours as needed. 01/21/19  Yes Eber HongMiller, Brian, MD  lovastatin (MEVACOR) 20 MG tablet Take 40 mg by mouth at bedtime. 01/09/19  Yes [provider]  Omega-3 Fatty Acids (FISH OIL) 1000 MG CAPS Take 1 capsule by mouth daily.   Yes [provider]    Physical Exam: Vitals:   01/22/19 1959 01/22/19 2306  BP: 113/75 107/69  Pulse: 89 90  Resp: (!)  21   Temp: 99.8 F (37.7 C)   TempSrc: Oral   SpO2: 98% 99%  Weight: 63 kg   Height: 5\' 2"  (1.575 m)     Constitutional: NAD, calm, comfortable Eyes: PERRL, lids and conjunctivae normal ENMT: Mucous membranes are moist. Posterior pharynx clear of any exudate or lesions. Neck: normal, supple, no masses, no thyromegaly Respiratory: clear to auscultation bilaterally, no wheezing, no crackles. Normal respiratory effort. No accessory muscle use.  Cardiovascular: Regular rate and rhythm, no murmurs / rubs / gallops. No extremity edema. 2+ pedal pulses. No carotid bruits.  Abdomen: Soft, positive suprapubic and bilateral flank tenderness, no no guarding or rebound, masses palpated. No hepatosplenomegaly. Bowel sounds positive.  Musculoskeletal: no clubbing / cyanosis. No joint deformity upper and lower extremities. Good ROM, no contractures. Normal muscle tone.  Skin: no rashes, lesions, ulcers. No induration Neurologic: CN 2-12 grossly intact. Sensation intact, DTR normal. Strength 5/5 in all 4.  Psychiatric: Normal judgment and insight. Alert and oriented x 3. Normal mood.   Labs on Admission: I have personally reviewed following labs and imaging studies  CBC: Recent Labs  Lab 01/21/19 1035 01/22/19 2117  WBC 10.0 8.6  NEUTROABS 8.7* 6.9  HGB 12.5 12.3  HCT 37.2 36.9  MCV 93.5 93.2  PLT 145* 151   Basic Metabolic Panel: Recent Labs  Lab 01/21/19 1035 01/22/19 2117  NA 136 140  K 3.4* 3.0*  CL 99 105  CO2 24 24  GLUCOSE 123* 124*  BUN 8 8  CREATININE 0.54 0.51  CALCIUM 9.1 9.1  MG  --  2.0  PHOS  --  1.9*   GFR: Estimated Creatinine Clearance: 73.4 mL/min (by C-G formula based on SCr of 0.51 mg/dL). Liver Function Tests: Recent Labs  Lab 01/22/19 2117  AST 56*  ALT 73*  ALKPHOS 158*  BILITOT 0.8  PROT 7.5  ALBUMIN 3.8   No results for input(s): LIPASE, AMYLASE in the last 168 hours. No results for input(s): AMMONIA in the last 168 hours. Coagulation  Profile: No results for input(s): INR, PROTIME in the last 168 hours. Cardiac Enzymes: No results for input(s): CKTOTAL, CKMB, CKMBINDEX, TROPONINI in the last 168 hours. BNP (last 3 results) No results for input(s): PROBNP in the last 8760 hours. HbA1C: No results for input(s): HGBA1C in the last 72 hours. CBG: No results for input(s): GLUCAP in the last 168 hours. Lipid Profile: No results for input(s): CHOL, HDL, LDLCALC, TRIG, CHOLHDL, LDLDIRECT in the last 72 hours. Thyroid Function Tests: No results for input(s): TSH, T4TOTAL, FREET4, T3FREE, THYROIDAB in the last 72 hours. Anemia Panel: No results for input(s): VITAMINB12, FOLATE, FERRITIN, TIBC, IRON, RETICCTPCT in the last 72 hours. Urine analysis:    Component Value Date/Time   COLORURINE STRAW (A) 01/22/2019 2117   APPEARANCEUR CLEAR 01/22/2019 2117   LABSPEC 1.005 01/22/2019 2117   PHURINE 7.0 01/22/2019 2117   GLUCOSEU NEGATIVE 01/22/2019 2117   HGBUR LARGE (A) 01/22/2019  2117   BILIRUBINUR NEGATIVE 01/22/2019 2117   KETONESUR 20 (A) 01/22/2019 2117   PROTEINUR NEGATIVE 01/22/2019 2117   UROBILINOGEN 0.2 03/12/2015 1136   NITRITE NEGATIVE 01/22/2019 2117   LEUKOCYTESUR NEGATIVE 01/22/2019 2117    Radiological Exams on Admission: Ct Head Wo Contrast  Result Date: 01/21/2019 CLINICAL DATA:  Headaches since hitting head on a wall 3 weeks ago while cleaning. EXAM: CT HEAD WITHOUT CONTRAST TECHNIQUE: Contiguous axial images were obtained from the base of the skull through the vertex without intravenous contrast. COMPARISON:  01/23/2016 FINDINGS: Brain: There is no evidence of acute infarct, intracranial hemorrhage, mass, midline shift, or extra-axial fluid collection. The ventricles and sulci are normal. Vascular: No hyperdense vessel. Skull: No fracture or focal osseous lesion. Sinuses/Orbits: Visualized paranasal sinuses are clear. There may be trace mastoid fluid bilaterally. The orbits are unremarkable. Other: None.  IMPRESSION: Negative head CT. Electronically Signed   By: Sebastian AcheAllen  Grady M.D.   On: 01/21/2019 09:51   Ct Renal Stone Study  Result Date: 01/21/2019 CLINICAL DATA:  Bilateral flank pain EXAM: CT ABDOMEN AND PELVIS WITHOUT CONTRAST TECHNIQUE: Multidetector CT imaging of the abdomen and pelvis was performed following the standard protocol without IV contrast. COMPARISON:  03/12/2015 FINDINGS: Lower chest: Minor dependent basilar atelectasis. Stable calcified right lower lobe granuloma measuring 5 mm, image 32 series 4. No lower lobe pneumonia. Normal heart size. No pericardial pleural effusion. Hepatobiliary: No focal liver abnormality is seen. No gallstones, gallbladder wall thickening, or biliary dilatation. Pancreas: Unremarkable. No pancreatic ductal dilatation or surrounding inflammatory changes. Spleen: Normal in size without focal abnormality. Adrenals/Urinary Tract: Normal adrenal glands. Left kidney and ureter demonstrate minor perinephric and periureteral strandy edema proximally. Slight pelviectasis of the left kidney. Left ureter does not demonstrate any radiopaque obstructing calculus. No UVJ abnormality. Appearance compatible with recent stone passage versus ascending urinary tract infection or pyelonephritis. Correlate with urinalysis. Right kidney and ureter are unremarkable without acute process or obstruction. Bladder is underdistended. Stomach/Bowel: Negative for bowel obstruction, significant dilatation, ileus, or free air. Normal appendix identified. No free fluid, fluid collection, hemorrhage, hematoma, abscess, or ascites. Vascular/Lymphatic: Negative for aneurysm. No significant atherosclerosis. Retroaortic left renal vein noted, normal variant. No adenopathy. Reproductive: Uterus and bilateral adnexa are unremarkable. Other: No abdominal wall hernia or abnormality. No abdominopelvic ascites. Musculoskeletal: No acute osseous finding. IMPRESSION: Mild left perinephric and proximal  periureteral strandy edema/inflammation without associated radiopaque obstructing stone identified. Suspect recent stone passage versus ascending urinary tract infection or pyelonephritis. No other acute intra-abdominopelvic finding by noncontrast CT. Electronically Signed   By: Judie PetitM.  Shick M.D.   On: 01/21/2019 10:34    EKG: Independently reviewed.    Assessment/Plan Principal Problem:   Acute pyelonephritis   Bacteremia due to Gram-negative bacteria Admit to MedSurg/inpatient. Continue IV fluids. Continue ceftriaxone 1 g IVPB every 24 hours. Follow-up blood culture and sensitivity. Follow-up urine culture and sensitivity.  Active Problems:   Headache Analgesics as needed.    Hypokalemia Replacing. Follow-up potassium level.    Hypophosphatemia Replacing. Follow-up phosphorus level as needed.    NASH (nonalcoholic steatohepatitis) Previously seen in CT abdomen from 2016. Hold lovastatin. Check RUQ ultrasound. May benefit from weight loss. Consider Actigall. If statin is needed use pravastatin.    GERD (gastroesophageal reflux disease) Famotidine 20 mg p.o. twice daily.    Hypercholesterolemia Hold lovastatin.    Type 2 diabetes mellitus (HCC) Carbohydrate modified diet. CBG monitoring before meals and bedtime.      DVT prophylaxis: Lovenox SQ.  Code Status: Full code. Family Communication:  Disposition Plan: Admit for IV antibiotics, pending C&S identification. Consults called: Admission status: Inpatient/MedSurg.   Bobette Moavid Manuel Alanny Rivers MD [facility]  01/22/2019, 11:18 PM   This document was prepared using Dragon voice recognition software and may contain some unintended transcription errors.

## 2019-01-22 NOTE — ED Triage Notes (Signed)
Pt reports being seen here yesterday for headache. Pt says she has been taking medication as prescribed and headache has gotten worse. Pt was diagnosed with pyelonephritis yesterday and started on anabiotics. Pt says she received phone call today from here and said, "she had infection in her blood." Pt denies any other symptoms.

## 2019-01-22 NOTE — ED Notes (Signed)
ED TO INPATIENT HANDOFF REPORT  ED Nurse Name and Phone #: [phone]  S Name/Age/Gender Mary [name] 48 y.o. female Room/Bed: APA03/APA03  Code Status   Code Status: Not on file  Home/SNF/Other Home Patient oriented to: self Is this baseline? Yes   Triage Complete: Triage complete  Chief Complaint Headache, back pain   Triage Note Pt reports being seen here yesterday for headache. Pt says she has been taking medication as prescribed and headache has gotten worse. Pt was diagnosed with pyelonephritis yesterday and started on anabiotics. Pt says she received phone call today from here and said, "she had infection in her blood." Pt denies any other symptoms.    Allergies Allergies  Allergen Reactions  . Oxycodone Nausea Only    Level of Care/Admitting Diagnosis ED Disposition    ED Disposition Condition Comment   No Disposition Selected  Hospital Area: [facility] [[id number]]  Level of Care: Med-Surg [16]  Covid Evaluation: Screening Protocol (No Symptoms)  Diagnosis: Acute pyelonephritis [176160]  Admitting Physician: Reubin Milan [[id number]]  Attending Physician: Reubin Milan [[id number]]  PT Class (Do Not Modify): Observation [104]  PT Acc Code (Do Not Modify): Observation [10022]       B Medical/Surgery History Past Medical History:  Diagnosis Date  . Anxiety   . Diabetes mellitus without complication ([place])    pre diabetic  . GERD (gastroesophageal reflux disease)   . Hypercholesterolemia    Past Surgical History:  Procedure Laterality Date  . BREAST SURGERY Right 2012   small lump in breast removed  . BREAST SURGERY     right breast biopsy 2010  . CESAREAN SECTION     3 c-section   . three c sections    . TONSILLECTOMY  04/21/2012   Procedure: TONSILLECTOMY;  Surgeon: Rozetta Nunnery, MD;  Location: [place];  Service: ENT;  Laterality: N/A;  tonsillectomy  . TUBAL LIGATION       A IV  Location/Drains/Wounds Patient Lines/Drains/Airways Status   Active Line/Drains/Airways    Name:   Placement date:   Placement time:   Site:   Days:   Peripheral IV 01/22/19 Left Antecubital   01/22/19    2130    Antecubital   less than 1   Incision 04/21/12 Lip Other (Comment)   04/21/12    0931     2467          Intake/Output Last 24 hours No intake or output data in the 24 hours ending 01/22/19 2254  Labs/Imaging Results for orders placed or performed during the hospital encounter of 01/22/19 (from the past 48 hour(s))  Lactic acid, plasma     Status: None   Collection Time: 01/22/19  9:17 PM  Result Value Ref Range   Lactic Acid, Venous 0.9 0.5 - 1.9 mmol/L    Comment: Performed at [facility], [address], [place], [place] [address]  CBC with Differential/Platelet     Status: None   Collection Time: 01/22/19  9:17 PM  Result Value Ref Range   WBC 8.6 4.0 - 10.5 K/uL   RBC 3.96 3.87 - 5.11 MIL/uL   Hemoglobin 12.3 12.0 - 15.0 g/dL   HCT 36.9 36.0 - 46.0 %   MCV 93.2 80.0 - 100.0 fL   MCH 31.1 26.0 - 34.0 pg   MCHC 33.3 30.0 - 36.0 g/dL   RDW 13.0 11.5 - 15.5 %   Platelets 151 150 - 400 K/uL   nRBC 0.0  0.0 - 0.2 %   Neutrophils Relative % 82 %   Neutro Abs 6.9 1.7 - 7.7 K/uL   Lymphocytes Relative 12 %   Lymphs Abs 1.0 0.7 - 4.0 K/uL   Monocytes Relative 6 %   Monocytes Absolute 0.6 0.1 - 1.0 K/uL   Eosinophils Relative 0 %   Eosinophils Absolute 0.0 0.0 - 0.5 K/uL   Basophils Relative 0 %   Basophils Absolute 0.0 0.0 - 0.1 K/uL   Immature Granulocytes 0 %   Abs Immature Granulocytes 0.03 0.00 - 0.07 K/uL    Comment: Performed at [facility], [address] [place], [place] [address]  Comprehensive metabolic panel     Status: Abnormal   Collection Time: 01/22/19  9:17 PM  Result Value Ref Range   Sodium 140 135 - 145 mmol/L   Potassium 3.0 (L) 3.5 - 5.1 mmol/L   Chloride 105 98 - 111 mmol/L   CO2 24 22 - 32 mmol/L   Glucose, Bld 124 (H) 70 - 99 mg/dL    BUN 8 6 - 20 mg/dL   Creatinine, Ser 1.300.51 0.44 - 1.00 mg/dL   Calcium 9.1 8.9 - 86.510.3 mg/dL   Total Protein 7.5 6.5 - 8.1 g/dL   Albumin 3.8 3.5 - 5.0 g/dL   AST 56 (H) 15 - 41 U/L   ALT 73 (H) 0 - 44 U/L   Alkaline Phosphatase 158 (H) 38 - 126 U/L   Total Bilirubin 0.8 0.3 - 1.2 mg/dL   GFR calc non Af Amer >60 >60 mL/min   GFR calc Af Amer >60 >60 mL/min   Anion gap 11 5 - 15    Comment: Performed at [facility], [address] [place], [place] [address]  Urinalysis, Routine w reflex microscopic     Status: Abnormal   Collection Time: 01/22/19  9:17 PM  Result Value Ref Range   Color, Urine STRAW (A) YELLOW   APPearance CLEAR CLEAR   Specific Gravity, Urine 1.005 1.005 - 1.030   pH 7.0 5.0 - 8.0   Glucose, UA NEGATIVE NEGATIVE mg/dL   Hgb urine dipstick LARGE (A) NEGATIVE   Bilirubin Urine NEGATIVE NEGATIVE   Ketones, ur 20 (A) NEGATIVE mg/dL   Protein, ur NEGATIVE NEGATIVE mg/dL   Nitrite NEGATIVE NEGATIVE   Leukocytes,Ua NEGATIVE NEGATIVE   RBC / HPF 21-50 0 - 5 RBC/hpf   WBC, UA 0-5 0 - 5 WBC/hpf   Bacteria, UA RARE (A) NONE SEEN   Squamous Epithelial / LPF 0-5 0 - 5   Mucus PRESENT     Comment: Performed at [facility], [address] [place], [place] [address]  Magnesium     Status: None   Collection Time: 01/22/19  9:17 PM  Result Value Ref Range   Magnesium 2.0 1.7 - 2.4 mg/dL    Comment: Performed at [facility], [address] [place], [place] [address]  Phosphorus     Status: Abnormal   Collection Time: 01/22/19  9:17 PM  Result Value Ref Range   Phosphorus 1.9 (L) 2.5 - 4.6 mg/dL    Comment: Performed at [facility], [address] [place], [place] [address]   Ct Head Wo Contrast  Result Date: 01/21/2019 CLINICAL DATA:  Headaches since hitting head on a wall 3 weeks ago while cleaning. EXAM: CT HEAD WITHOUT CONTRAST TECHNIQUE: Contiguous axial images were obtained from the base of the skull through the vertex without intravenous contrast. COMPARISON:   01/23/2016 FINDINGS: Brain: There is  no evidence of acute infarct, intracranial hemorrhage, mass, midline shift, or extra-axial fluid collection. The ventricles and sulci are normal. Vascular: No hyperdense vessel. Skull: No fracture or focal osseous lesion. Sinuses/Orbits: Visualized paranasal sinuses are clear. There may be trace mastoid fluid bilaterally. The orbits are unremarkable. Other: None. IMPRESSION: Negative head CT. Electronically Signed   By: Sebastian AcheAllen  Grady M.D.   On: 01/21/2019 09:51   Ct Renal Stone Study  Result Date: 01/21/2019 CLINICAL DATA:  Bilateral flank pain EXAM: CT ABDOMEN AND PELVIS WITHOUT CONTRAST TECHNIQUE: Multidetector CT imaging of the abdomen and pelvis was performed following the standard protocol without IV contrast. COMPARISON:  03/12/2015 FINDINGS: Lower chest: Minor dependent basilar atelectasis. Stable calcified right lower lobe granuloma measuring 5 mm, image 32 series 4. No lower lobe pneumonia. Normal heart size. No pericardial pleural effusion. Hepatobiliary: No focal liver abnormality is seen. No gallstones, gallbladder wall thickening, or biliary dilatation. Pancreas: Unremarkable. No pancreatic ductal dilatation or surrounding inflammatory changes. Spleen: Normal in size without focal abnormality. Adrenals/Urinary Tract: Normal adrenal glands. Left kidney and ureter demonstrate minor perinephric and periureteral strandy edema proximally. Slight pelviectasis of the left kidney. Left ureter does not demonstrate any radiopaque obstructing calculus. No UVJ abnormality. Appearance compatible with recent stone passage versus ascending urinary tract infection or pyelonephritis. Correlate with urinalysis. Right kidney and ureter are unremarkable without acute process or obstruction. Bladder is underdistended. Stomach/Bowel: Negative for bowel obstruction, significant dilatation, ileus, or free air. Normal appendix identified. No free fluid, fluid collection, hemorrhage,  hematoma, abscess, or ascites. Vascular/Lymphatic: Negative for aneurysm. No significant atherosclerosis. Retroaortic left renal vein noted, normal variant. No adenopathy. Reproductive: Uterus and bilateral adnexa are unremarkable. Other: No abdominal wall hernia or abnormality. No abdominopelvic ascites. Musculoskeletal: No acute osseous finding. IMPRESSION: Mild left perinephric and proximal periureteral strandy edema/inflammation without associated radiopaque obstructing stone identified. Suspect recent stone passage versus ascending urinary tract infection or pyelonephritis. No other acute intra-abdominopelvic finding by noncontrast CT. Electronically Signed   By: Judie PetitM.  Shick M.D.   On: 01/21/2019 10:34    Pending Labs Unresulted Labs (From admission, onward)    Start     Ordered   01/22/19 2218  Novel Coronavirus,NAA,(SEND-OUT TO REF LAB - TAT 24-48 hrs); Hosp Order  (Asymptomatic Patients Labs)  Once,   R    Question:  Rule Out  Answer:  Yes   01/22/19 2217          Vitals/Pain Today's Vitals   01/22/19 1959  BP: 113/75  Pulse: 89  Resp: (!) 21  Temp: 99.8 F (37.7 C)  TempSrc: Oral  SpO2: 98%  Weight: 63 kg  Height: 5\' 2"  (1.575 m)    Isolation Precautions No active isolations  Medications Medications  0.9 % NaCl with KCl 40 mEq / L  infusion (has no administration in time range)  magnesium sulfate IVPB 2 g 50 mL (has no administration in time range)  cefTRIAXone (ROCEPHIN) 1 g in sodium chloride 0.9 % 100 mL IVPB (0 g Intravenous Stopped 01/22/19 2211)  ketorolac (TORADOL) 30 MG/ML injection 30 mg (30 mg Intravenous Given 01/22/19 2141)    Mobility walks Low fall risk   Focused Assessments    R Recommendations: See Admitting Provider Note  Report given to:   Additional Notes:

## 2019-01-22 NOTE — ED Provider Notes (Addendum)
[facility] EMERGENCY DEPARTMENT Provider Note   CSN: [id number] Arrival date & time: 01/22/19  1906    History   Chief Complaint Chief Complaint  Patient presents with  . Headache    HPI Mary [name] is a 48 y.o. female.     HPI  The patient is a 48 year old female, she is a known patient with acid reflux, hypercholesterol and anxiety.  I actually saw this patient yesterday when she was complaining of having headache after striking her head at work.  This was more than a week prior to her visit and her CT scan was negative.  She was informed that she was likely having concussion spells and that she would need some time to get better, she presents again today for a couple of reasons, first because of ongoing headache and second because she states that she was called and told to come back because of an presence of bacteria in her bloodstream.  When I saw the patient yesterday she had a urinary tract infection and because she had a borderline blood pressure I did obtain blood cultures.  These cultures did grow gram-negative rods today and since she did have a urinary tract infection I have to suspect that this is what that is from.  She states that she has had the feeling of subjective fevers today, nausea but is not vomiting, not having diarrhea, her abdomen is slightly uncomfortable as well.  She has been taking her Keflex.  Past Medical History:  Diagnosis Date  . Anxiety   . Diabetes mellitus without complication (HCC)    pre diabetic  . GERD (gastroesophageal reflux disease)   . Hypercholesterolemia     Patient Active Problem List   Diagnosis Date Noted  . Acute pyelonephritis 01/22/2019    Past Surgical History:  Procedure Laterality Date  . BREAST SURGERY Right 2012   small lump in breast removed  . BREAST SURGERY     right breast biopsy 2010  . CESAREAN SECTION     3 c-section   . three c sections    . TONSILLECTOMY  04/21/2012   Procedure:  TONSILLECTOMY;  Surgeon: Drema Halonhristopher E Newman, MD;  Location: [facility];  Service: ENT;  Laterality: N/A;  tonsillectomy  . TUBAL LIGATION       OB History   No obstetric history on file.      Home Medications    Prior to Admission medications   Medication Sig Start Date End Date Taking? Authorizing Provider  cephALEXin (KEFLEX) 500 MG capsule Take 1 capsule (500 mg total) by mouth 2 (two) times daily for 10 days. 01/21/19 01/31/19  Eber HongMiller, Lenwood Balsam, MD  ibuprofen (ADVIL) 400 MG tablet Take 1 tablet (400 mg total) by mouth every 6 (six) hours as needed. 01/21/19   Eber HongMiller, Jodye Scali, MD  lovastatin (MEVACOR) 20 MG tablet Take 40 mg by mouth at bedtime. 01/09/19   [provider]  Omega-3 Fatty Acids (FISH OIL) 1000 MG CAPS Take 1 capsule by mouth daily.    [provider]    Family History Family History  Problem Relation Age of Onset  . Diabetes Mother        deceased  . Diabetes Sister        deceased  . Cancer Sister 355       ovarian   . Diabetes Brother   . Diabetes Sister   . Diabetes Sister     Social History Social History   Tobacco Use  .  Smoking status: Never Smoker  . Smokeless tobacco: Never Used  Substance Use Topics  . Alcohol use: Not Currently    Frequency: Never  . Drug use: No     Allergies   Oxycodone   Review of Systems Review of Systems   Physical Exam Updated Vital Signs BP 113/75 (BP Location: Right Arm)   Pulse 89   Temp 99.8 F (37.7 C) (Oral)   Resp (!) 21   Ht 1.575 m (5\' 2" )   Wt 63 kg   SpO2 98%   BMI 25.42 kg/m   Physical Exam Vitals signs and nursing note reviewed.  Constitutional:      General: She is not in acute distress.    Appearance: She is well-developed.  HENT:     Head: Normocephalic and atraumatic.     Mouth/Throat:     Pharynx: No oropharyngeal exudate.  Eyes:     General: No scleral icterus.       Right eye: No discharge.        Left eye: No discharge.      Conjunctiva/sclera: Conjunctivae normal.     Pupils: Pupils are equal, round, and reactive to light.  Neck:     Musculoskeletal: Normal range of motion and neck supple.     Thyroid: No thyromegaly.     Vascular: No JVD.  Cardiovascular:     Rate and Rhythm: Normal rate and regular rhythm.     Heart sounds: Normal heart sounds. No murmur. No friction rub. No gallop.   Pulmonary:     Effort: Pulmonary effort is normal. No respiratory distress.     Breath sounds: Normal breath sounds. No wheezing or rales.  Abdominal:     General: Bowel sounds are normal. There is no distension.     Palpations: Abdomen is soft. There is no mass.     Tenderness: There is no abdominal tenderness.  Musculoskeletal: Normal range of motion.        General: No tenderness.  Lymphadenopathy:     Cervical: No cervical adenopathy.  Skin:    General: Skin is warm and dry.     Findings: No erythema or rash.  Neurological:     Mental Status: She is alert.     Coordination: Coordination normal.     Comments: Neurologic exam:  Speech clear, pupils equal round reactive to light, extraocular movements intact  Normal peripheral visual fields Cranial nerves III through XII normal including no facial droop Follows commands, moves all extremities x4, normal strength to bilateral upper and lower extremities at all major muscle groups including grip Sensation normal to light touch and pinprick Coordination intact, no limb ataxia, finger-nose-finger normal, heel shin normal bilaterally Rapid alternating movements normal No pronator drift Gait normal Can heal and toe walk without weakness.   Psychiatric:        Behavior: Behavior normal.      ED Treatments / Results  Labs (all labs ordered are listed, but only abnormal results are displayed) Labs Reviewed  COMPREHENSIVE METABOLIC PANEL - Abnormal; Notable for the following components:      Result Value   Potassium 3.0 (*)    Glucose, Bld 124 (*)    AST 56  (*)    ALT 73 (*)    Alkaline Phosphatase 158 (*)    All other components within normal limits  URINALYSIS, ROUTINE W REFLEX MICROSCOPIC - Abnormal; Notable for the following components:   Color, Urine STRAW (*)    Hgb urine dipstick  LARGE (*)    Ketones, ur 20 (*)    Bacteria, UA RARE (*)    All other components within normal limits  PHOSPHORUS - Abnormal; Notable for the following components:   Phosphorus 1.9 (*)    All other components within normal limits  NOVEL CORONAVIRUS, NAA (HOSPITAL ORDER, SEND-OUT TO REF LAB)  LACTIC ACID, PLASMA  CBC WITH DIFFERENTIAL/PLATELET  MAGNESIUM    EKG None  Radiology Ct Head Wo Contrast  Result Date: 01/21/2019 CLINICAL DATA:  Headaches since hitting head on a wall 3 weeks ago while cleaning. EXAM: CT HEAD WITHOUT CONTRAST TECHNIQUE: Contiguous axial images were obtained from the base of the skull through the vertex without intravenous contrast. COMPARISON:  01/23/2016 FINDINGS: Brain: There is no evidence of acute infarct, intracranial hemorrhage, mass, midline shift, or extra-axial fluid collection. The ventricles and sulci are normal. Vascular: No hyperdense vessel. Skull: No fracture or focal osseous lesion. Sinuses/Orbits: Visualized paranasal sinuses are clear. There may be trace mastoid fluid bilaterally. The orbits are unremarkable. Other: None. IMPRESSION: Negative head CT. Electronically Signed   By: Sebastian AcheAllen  Grady M.D.   On: 01/21/2019 09:51   Ct Renal Stone Study  Result Date: 01/21/2019 CLINICAL DATA:  Bilateral flank pain EXAM: CT ABDOMEN AND PELVIS WITHOUT CONTRAST TECHNIQUE: Multidetector CT imaging of the abdomen and pelvis was performed following the standard protocol without IV contrast. COMPARISON:  03/12/2015 FINDINGS: Lower chest: Minor dependent basilar atelectasis. Stable calcified right lower lobe granuloma measuring 5 mm, image 32 series 4. No lower lobe pneumonia. Normal heart size. No pericardial pleural effusion.  Hepatobiliary: No focal liver abnormality is seen. No gallstones, gallbladder wall thickening, or biliary dilatation. Pancreas: Unremarkable. No pancreatic ductal dilatation or surrounding inflammatory changes. Spleen: Normal in size without focal abnormality. Adrenals/Urinary Tract: Normal adrenal glands. Left kidney and ureter demonstrate minor perinephric and periureteral strandy edema proximally. Slight pelviectasis of the left kidney. Left ureter does not demonstrate any radiopaque obstructing calculus. No UVJ abnormality. Appearance compatible with recent stone passage versus ascending urinary tract infection or pyelonephritis. Correlate with urinalysis. Right kidney and ureter are unremarkable without acute process or obstruction. Bladder is underdistended. Stomach/Bowel: Negative for bowel obstruction, significant dilatation, ileus, or free air. Normal appendix identified. No free fluid, fluid collection, hemorrhage, hematoma, abscess, or ascites. Vascular/Lymphatic: Negative for aneurysm. No significant atherosclerosis. Retroaortic left renal vein noted, normal variant. No adenopathy. Reproductive: Uterus and bilateral adnexa are unremarkable. Other: No abdominal wall hernia or abnormality. No abdominopelvic ascites. Musculoskeletal: No acute osseous finding. IMPRESSION: Mild left perinephric and proximal periureteral strandy edema/inflammation without associated radiopaque obstructing stone identified. Suspect recent stone passage versus ascending urinary tract infection or pyelonephritis. No other acute intra-abdominopelvic finding by noncontrast CT. Electronically Signed   By: Judie PetitM.  Shick M.D.   On: 01/21/2019 10:34    Procedures Procedures (including critical care time)  Medications Ordered in ED Medications  0.9 % NaCl with KCl 40 mEq / L  infusion (has no administration in time range)  magnesium sulfate IVPB 2 g 50 mL (has no administration in time range)  cefTRIAXone (ROCEPHIN) 1 g in sodium  chloride 0.9 % 100 mL IVPB (0 g Intravenous Stopped 01/22/19 2211)  ketorolac (TORADOL) 30 MG/ML injection 30 mg (30 mg Intravenous Given 01/22/19 2141)     Initial Impression / Assessment and Plan / ED Course  I have reviewed the triage vital signs and the nursing notes.  Pertinent labs & imaging results that were available during my care of the patient  were reviewed by me and considered in my medical decision making (see chart for details).        The patient's labs from her last visit as well as this visit were reviewed.  She is slightly hypokalemic and thankfully does not have a leukocytosis or elevated lactic acid.  Her vital signs are improved from yesterday however given her known bacteremia with gram-negative rods I suspect that she likely has some low-level infection which could become more serious.  She was given Rocephin, IV fluids and admitted to the hospitalist overnight for observation.  Patient agreeable  Final Clinical Impressions(s) / ED Diagnoses   Final diagnoses:  Bacteremia  Post concussive syndrome    ED Discharge Orders    None       Mary Chapel, MD 01/22/19 5189    Mary Chapel, MD 02/24/19 1108

## 2019-01-22 NOTE — Progress Notes (Signed)
PHARMACY NOTE:  ANTIMICROBIAL INDICATION DOSAGE ADJUSTMENT  Current antimicrobial regimen includes a mismatch between antimicrobial dosage and indication.  As per policy approved by the Pharmacy & Therapeutics and Medical Executive Committees, the antimicrobial dosage will be adjusted accordingly.  Current antimicrobial dosage:  Ceftriaxone 1 gram Q24   Indication: bacteremia  Renal Function:  Estimated Creatinine Clearance: 73.4 mL/min (by C-G formula based on SCr of 0.51 mg/dL). []      On intermittent HD, scheduled: []      On CRRT    Antimicrobial dosage has been changed to:  Ceftriaxone 2 grams Q24  Additional comments:   Thank you for allowing pharmacy to be a part of this patient's care.  Nyra Capes, [facility] 01/22/2019 11:31 PM

## 2019-01-22 NOTE — ED Notes (Addendum)
T/c from lab stating pt's 1 anaerobic culture bottle came back with gram neg rods, EDP, Pollina advised, per EDP have day shift call pt to see how she is feeling, if pt is running fever, vomiting or diarrhea pt will need to come back, if pt feeling okay can continue Keflex given at d/c, will pass info to day shift charge nurse

## 2019-01-23 ENCOUNTER — Inpatient Hospital Stay (HOSPITAL_COMMUNITY): Payer: Self-pay

## 2019-01-23 DIAGNOSIS — G43909 Migraine, unspecified, not intractable, without status migrainosus: Secondary | ICD-10-CM

## 2019-01-23 DIAGNOSIS — R740 Nonspecific elevation of levels of transaminase and lactic acid dehydrogenase [LDH]: Secondary | ICD-10-CM

## 2019-01-23 DIAGNOSIS — E1165 Type 2 diabetes mellitus with hyperglycemia: Secondary | ICD-10-CM

## 2019-01-23 DIAGNOSIS — K219 Gastro-esophageal reflux disease without esophagitis: Secondary | ICD-10-CM

## 2019-01-23 DIAGNOSIS — R7881 Bacteremia: Secondary | ICD-10-CM

## 2019-01-23 DIAGNOSIS — E876 Hypokalemia: Secondary | ICD-10-CM | POA: Diagnosis present

## 2019-01-23 DIAGNOSIS — E7800 Pure hypercholesterolemia, unspecified: Secondary | ICD-10-CM

## 2019-01-23 DIAGNOSIS — K7581 Nonalcoholic steatohepatitis (NASH): Secondary | ICD-10-CM | POA: Diagnosis present

## 2019-01-23 DIAGNOSIS — R7401 Elevation of levels of liver transaminase levels: Secondary | ICD-10-CM | POA: Diagnosis present

## 2019-01-23 LAB — COMPREHENSIVE METABOLIC PANEL
ALT: 56 U/L — ABNORMAL HIGH (ref 0–44)
AST: 44 U/L — ABNORMAL HIGH (ref 15–41)
Albumin: 2.9 g/dL — ABNORMAL LOW (ref 3.5–5.0)
Alkaline Phosphatase: 130 U/L — ABNORMAL HIGH (ref 38–126)
Anion gap: 5 (ref 5–15)
BUN: 6 mg/dL (ref 6–20)
CO2: 23 mmol/L (ref 22–32)
Calcium: 8 mg/dL — ABNORMAL LOW (ref 8.9–10.3)
Chloride: 111 mmol/L (ref 98–111)
Creatinine, Ser: 0.38 mg/dL — ABNORMAL LOW (ref 0.44–1.00)
GFR calc Af Amer: >60 mL/min (ref >60)
GFR calc non Af Amer: >60 mL/min (ref >60)
Glucose, Bld: 114 mg/dL — ABNORMAL HIGH (ref 70–99)
Potassium: 3.8 mmol/L (ref 3.5–5.1)
Sodium: 139 mmol/L (ref 135–145)
Total Bilirubin: 0.6 mg/dL (ref 0.3–1.2)
Total Protein: 5.9 g/dL — ABNORMAL LOW (ref 6.5–8.1)

## 2019-01-23 LAB — GLUCOSE, CAPILLARY
Glucose-Capillary: 102 mg/dL — ABNORMAL HIGH (ref 70–99)
Glucose-Capillary: 106 mg/dL — ABNORMAL HIGH (ref 70–99)
Glucose-Capillary: 120 mg/dL — ABNORMAL HIGH (ref 70–99)
Glucose-Capillary: 145 mg/dL — ABNORMAL HIGH (ref 70–99)
Glucose-Capillary: 97 mg/dL (ref 70–99)

## 2019-01-23 MED ORDER — METOCLOPRAMIDE HCL 5 MG/ML IJ SOLN
5.0000 mg | Freq: Once | INTRAMUSCULAR | Status: AC
  Administered 2019-01-23: 5 mg via INTRAVENOUS
  Filled 2019-01-23: qty 2

## 2019-01-23 MED ORDER — VALPROATE SODIUM 500 MG/5ML IV SOLN
500.0000 mg | Freq: Once | INTRAVENOUS | Status: AC
  Administered 2019-01-23: 500 mg via INTRAVENOUS
  Filled 2019-01-23: qty 5

## 2019-01-23 MED ORDER — FENTANYL CITRATE (PF) 100 MCG/2ML IJ SOLN
50.0000 ug | Freq: Once | INTRAMUSCULAR | Status: AC
  Administered 2019-01-23: 50 ug via INTRAVENOUS
  Filled 2019-01-23: qty 2

## 2019-01-23 MED ORDER — INSULIN ASPART 100 UNIT/ML ~~LOC~~ SOLN
0.0000 [IU] | Freq: Three times a day (TID) | SUBCUTANEOUS | Status: DC
  Administered 2019-01-23: 18:00:00 2 [IU] via SUBCUTANEOUS

## 2019-01-23 MED ORDER — DIPHENHYDRAMINE HCL 50 MG/ML IJ SOLN
12.5000 mg | Freq: Once | INTRAMUSCULAR | Status: AC
  Administered 2019-01-23: 12.5 mg via INTRAVENOUS
  Filled 2019-01-23: qty 1

## 2019-01-23 NOTE — Plan of Care (Signed)

## 2019-01-23 NOTE — Progress Notes (Signed)
PROGRESS NOTE    Mary [name]  SWF:[id number] DOB: [birth year] DOA: 01/22/2019 PCP: [facility] [place]     Brief Narrative:  48 y.o. female with medical history significant of anxiety, type 2 diabetes, GERD, hyperlipidemia who was seen yesterday in the emergency department due to UTI and returns today after blood culture reveal gram-negative bacteria.  She complains of urgency, suprapubic and bilateral flank tenderness more intense on the left.  She denies fever, but complains of chills, mild fatigue and malaise.  Mild decrease in appetite, but denies sore throat, rhinorrhea, dyspnea, chest pain, palpitations, dizziness, lower extremity edema, nausea, vomiting, diarrhea, melena or hematochezia. Denies polyuria, polydipsia, polyphagia or blurred vision.    Assessment & Plan: 1-acute E. coli pyelonephritis and bacteremia -Patient currently afebrile -Reports intermittent nausea but no vomiting -Culture sensitivity pending -Continue IV ceftriaxone -Continue as needed antiemetic and analgesics -No frank dysuria but complaining of mild right flank pain. -Follow clinical response. -Continue IV fluids  2-GERD (gastroesophageal reflux disease) -Continue PPI  3-Hypercholesterolemia -given elevated LFT's will hold lovastatin   4-type 2 diabetes with hyperglycemia -Not on medications prior to admission -Will check hemoglobin A1c -SSI while inpatient -continue modified carb diet  5-Transaminitis -in the setting of acute infection Vs NASH -will hold statins initially -provided IVF's -follow LFT's  6-hypokalemia -will replete and follow electrolytes trend  7-migraine -will give depakote, reglan and benadryl cocktail -follow improvement in HA -continue PRN tylenol  DVT prophylaxis: Lovenox Code Status: Full code Family Communication: No family at bedside. Disposition Plan: Remains inpatient, continue IV antibiotics, will provide acute therapy for  migraine using Depakote, Reglan and Benadryl.  Follow renal ultrasound.  Consultants:   None  Procedures:   See below for x-ray reports.  Antimicrobials:  Anti-infectives (From admission, onward)   Start     Dose/Rate Route Frequency Ordered Stop   01/22/19 2130  cefTRIAXone (ROCEPHIN) 1 g in sodium chloride 0.9 % 100 mL IVPB     1 g 200 mL/hr over 30 Minutes Intravenous  Once 01/22/19 2116 01/22/19 2211   01/22/19 2130  cefTRIAXone (ROCEPHIN) 2 g in sodium chloride 0.9 % 100 mL IVPB     2 g 200 mL/hr over 30 Minutes Intravenous Every 24 hours 01/22/19 2316        Subjective: No vomiting, currently afebrile.  Complaining of intermittent nausea, right flank pain and headache.  Objective: Vitals:   01/22/19 1959 01/22/19 2306 01/22/19 2343  BP: 113/75 107/69 109/69  Pulse: 89 90 89  Resp: (!) 21  18  Temp: 99.8 F (37.7 C)  98.8 F (37.1 C)  TempSrc: Oral  Oral  SpO2: 98% 99% 100%  Weight: 63 kg    Height: 5\' 2"  (1.575 m)      Intake/Output Summary (Last 24 hours) at 01/23/2019 1325 Last data filed at 01/23/2019 0305 Gross per 24 hour  Intake 1062.1 ml  Output -  Net 1062.1 ml   Filed Weights   01/22/19 1959  Weight: 63 kg    Examination: General exam: Alert, awake, oriented x 3; reporting mild nausea and complaining of headaches.  Patient reports no vomiting, no dysuria; expressed mild to moderate right flank abdominal pain. Respiratory system: Clear to auscultation. Respiratory effort normal. Cardiovascular system:RRR. No murmurs, rubs, gallops. Gastrointestinal system: Abdomen is nondistended, soft and mildly tender to palpation in her right flank. No organomegaly or masses felt. Normal bowel sounds heard. Central nervous system: Alert and oriented. No focal neurological deficits. Extremities: No C/C/E, +pedal  pulses Skin: No rashes, lesions or ulcers Psychiatry: Judgement and insight appear normal. Mood & affect appropriate.    Data Reviewed: I have  personally reviewed following labs and imaging studies  CBC: Recent Labs  Lab 01/21/19 1035 01/22/19 2117  WBC 10.0 8.6  NEUTROABS 8.7* 6.9  HGB 12.5 12.3  HCT 37.2 36.9  MCV 93.5 93.2  PLT 145* 151   Basic Metabolic Panel: Recent Labs  Lab 01/21/19 1035 01/22/19 2117 01/23/19 0452  NA 136 140 139  K 3.4* 3.0* 3.8  CL 99 105 111  CO2 24 24 23   GLUCOSE 123* 124* 114*  BUN 8 8 6   CREATININE 0.54 0.51 0.38*  CALCIUM 9.1 9.1 8.0*  MG  --  2.0  --   PHOS  --  1.9*  --    GFR: Estimated Creatinine Clearance: 73.4 mL/min (A) (by C-G formula based on SCr of 0.38 mg/dL (L)).   Liver Function Tests: Recent Labs  Lab 01/22/19 2117 01/23/19 0452  AST 56* 44*  ALT 73* 56*  ALKPHOS 158* 130*  BILITOT 0.8 0.6  PROT 7.5 5.9*  ALBUMIN 3.8 2.9*   CBG: Recent Labs  Lab 01/23/19 0000 01/23/19 0723 01/23/19 1117  GLUCAP 106* 102* 120*   Urine analysis:    Component Value Date/Time   COLORURINE STRAW (A) 01/22/2019 2117   APPEARANCEUR CLEAR 01/22/2019 2117   LABSPEC 1.005 01/22/2019 2117   PHURINE 7.0 01/22/2019 2117   GLUCOSEU NEGATIVE 01/22/2019 2117   HGBUR LARGE (A) 01/22/2019 2117   BILIRUBINUR NEGATIVE 01/22/2019 2117   KETONESUR 20 (A) 01/22/2019 2117   PROTEINUR NEGATIVE 01/22/2019 2117   UROBILINOGEN 0.2 03/12/2015 1136   NITRITE NEGATIVE 01/22/2019 2117   LEUKOCYTESUR NEGATIVE 01/22/2019 2117    Recent Results (from the past 240 hour(s))  Urine Culture     Status: Abnormal (Preliminary result)   Collection Time: 01/21/19 11:09 AM   Specimen: Urine, Clean Catch  Result Value Ref Range Status   Specimen Description   Final    URINE, CLEAN CATCH Performed at [facility], [address] [place], [place] [address]    Special Requests   Final    NONE Performed at [facility], [address] [place], [place] [address]    Culture >=100,000 COLONIES/mL ESCHERICHIA COLI (A)  Final   Report Status PENDING  Incomplete  Blood culture (routine x 2)      Status: None (Preliminary result)   Collection Time: 01/21/19 11:20 AM   Specimen: BLOOD RIGHT HAND  Result Value Ref Range Status   Specimen Description   Final    BLOOD RIGHT HAND BOTTLES DRAWN AEROBIC AND ANAEROBIC Performed at [facility], [address] [place], [place] [address]    Special Requests   Final    Blood Culture adequate volume Performed at [facility], [address] [place], [place] [address]    Culture  Setup Time   Final    GRAM NEGATIVE RODS Gram Stain Report Called to,Read Back By and Verified With: CRABTREE,B. AT 0018 ON 01/22/2019 BY EVA ANAEROBIC BOTTLE ONLY Performed at [facility] CRITICAL RESULT CALLED TO, READ BACK BY AND VERIFIED WITH: RN C MAYNARD @0326  01/22/19 BY S GEZAHEGN Performed at [facility], [address] [place], [place] [address]    Culture GRAM NEGATIVE RODS  Final   Report Status PENDING  Incomplete  Blood Culture ID Panel (Reflexed)     Status: Abnormal   Collection Time: 01/21/19 11:20 AM  Result Value Ref Range Status   Enterococcus species NOT DETECTED NOT DETECTED Final   Listeria monocytogenes NOT DETECTED NOT DETECTED Final   Staphylococcus species NOT DETECTED NOT DETECTED Final   Staphylococcus aureus (BCID) NOT DETECTED NOT DETECTED Final   Streptococcus species NOT DETECTED NOT DETECTED Final   Streptococcus agalactiae NOT DETECTED NOT DETECTED Final   Streptococcus pneumoniae NOT DETECTED NOT DETECTED Final   Streptococcus pyogenes NOT DETECTED NOT DETECTED Final   Acinetobacter baumannii NOT DETECTED NOT DETECTED Final   Enterobacteriaceae species DETECTED (A) NOT DETECTED Final    Comment: Enterobacteriaceae represent a large family of gram-negative bacteria, not a single organism. CRITICAL RESULT CALLED TO, READ BACK BY AND VERIFIED WITH: RN C MAYNARD @0326  01/22/19 BY S GEZAHEGN    Enterobacter cloacae complex NOT DETECTED NOT DETECTED Final   Escherichia coli DETECTED (A) NOT DETECTED Final     Comment: CRITICAL RESULT CALLED TO, READ BACK BY AND VERIFIED WITH: RN C MAYNARD @0326  01/22/19 BY S GEZAHEGN    Klebsiella oxytoca NOT DETECTED NOT DETECTED Final   Klebsiella pneumoniae NOT DETECTED NOT DETECTED Final   Proteus species NOT DETECTED NOT DETECTED Final   Serratia marcescens NOT DETECTED NOT DETECTED Final   Carbapenem resistance NOT DETECTED NOT DETECTED Final   Haemophilus influenzae NOT DETECTED NOT DETECTED Final   Neisseria meningitidis NOT DETECTED NOT DETECTED Final   Pseudomonas aeruginosa NOT DETECTED NOT DETECTED Final   Candida albicans NOT DETECTED NOT DETECTED Final   Candida glabrata NOT DETECTED NOT DETECTED Final   Candida krusei NOT DETECTED NOT DETECTED Final   Candida parapsilosis NOT DETECTED NOT DETECTED Final   Candida tropicalis NOT DETECTED NOT DETECTED Final    Comment: Performed at [facility], [address] [place], [place] [address]  Blood culture (routine x 2)     Status: None (Preliminary result)   Collection Time: 01/21/19 11:27 AM   Specimen: BLOOD LEFT ARM  Result Value Ref Range Status   Specimen Description BLOOD LEFT ARM BOTTLES DRAWN AEROBIC AND ANAEROBIC  Final   Special Requests   Final    Blood Culture results may not be optimal due to an excessive volume of blood received in culture bottles   Culture   Final    NO GROWTH 2 DAYS Performed at [facility], [address] [place], [place] [address]    Report Status PENDING  Incomplete     Scheduled Meds: . enoxaparin (LOVENOX) injection  40 mg Subcutaneous Q24H   Continuous Infusions: . cefTRIAXone (ROCEPHIN)  IV       LOS: 0 days    Time spent: 30 minutes.    Vassie Lollarlos Donni Oglesby, MD [facility] Pager [phone]  01/23/2019, 1:25 PM

## 2019-01-24 LAB — URINE CULTURE: Culture: >=100000 — AB

## 2019-01-24 LAB — CBC
HCT: 33.9 % — ABNORMAL LOW (ref 36.0–46.0)
Hemoglobin: 11 g/dL — ABNORMAL LOW (ref 12.0–15.0)
MCH: 30.6 pg (ref 26.0–34.0)
MCHC: 32.4 g/dL (ref 30.0–36.0)
MCV: 94.2 fL (ref 80.0–100.0)
Platelets: 186 10*3/uL (ref 150–400)
RBC: 3.6 MIL/uL — ABNORMAL LOW (ref 3.87–5.11)
RDW: 12.8 % (ref 11.5–15.5)
WBC: 7.7 10*3/uL (ref 4.0–10.5)
nRBC: 0 % (ref 0.0–0.2)

## 2019-01-24 LAB — COMPREHENSIVE METABOLIC PANEL
ALT: 70 U/L — ABNORMAL HIGH (ref 0–44)
AST: 41 U/L (ref 15–41)
Albumin: 3.3 g/dL — ABNORMAL LOW (ref 3.5–5.0)
Alkaline Phosphatase: 190 U/L — ABNORMAL HIGH (ref 38–126)
Anion gap: 10 (ref 5–15)
BUN: 7 mg/dL (ref 6–20)
CO2: 22 mmol/L (ref 22–32)
Calcium: 9.1 mg/dL (ref 8.9–10.3)
Chloride: 108 mmol/L (ref 98–111)
Creatinine, Ser: 0.41 mg/dL — ABNORMAL LOW (ref 0.44–1.00)
GFR calc Af Amer: >60 mL/min (ref >60)
GFR calc non Af Amer: >60 mL/min (ref >60)
Glucose, Bld: 102 mg/dL — ABNORMAL HIGH (ref 70–99)
Potassium: 3.7 mmol/L (ref 3.5–5.1)
Sodium: 140 mmol/L (ref 135–145)
Total Bilirubin: 0.5 mg/dL (ref 0.3–1.2)
Total Protein: 6.9 g/dL (ref 6.5–8.1)

## 2019-01-24 LAB — NOVEL CORONAVIRUS, NAA (HOSP ORDER, SEND-OUT TO REF LAB; TAT 18-24 HRS): SARS-CoV-2, NAA: NOT DETECTED

## 2019-01-24 LAB — CULTURE, BLOOD (ROUTINE X 2): Special Requests: ADEQUATE

## 2019-01-24 LAB — HEMOGLOBIN A1C
Hgb A1c MFr Bld: 5.9 % — ABNORMAL HIGH (ref 4.8–5.6)
Mean Plasma Glucose: 122.63 mg/dL

## 2019-01-24 LAB — GLUCOSE, CAPILLARY
Glucose-Capillary: 89 mg/dL (ref 70–99)
Glucose-Capillary: 145 mg/dL — ABNORMAL HIGH (ref 70–99)

## 2019-01-24 LAB — HIV ANTIBODY (ROUTINE TESTING W REFLEX): HIV Screen 4th Generation wRfx: NONREACTIVE

## 2019-01-24 MED ORDER — CEFDINIR 300 MG PO CAPS: 300.0000 mg | ORAL_CAPSULE | Freq: Two times a day (BID) | ORAL | 0 refills | Status: AC

## 2019-01-24 MED ORDER — BUTALBITAL-APAP-CAFFEINE 50-325-40 MG PO TABS: 1.0000 | ORAL_TABLET | Freq: Four times a day (QID) | ORAL | 0 refills | Status: AC | PRN

## 2019-01-24 MED ORDER — CEFDINIR 300 MG PO CAPS
300.0000 mg | ORAL_CAPSULE | Freq: Two times a day (BID) | ORAL | Status: DC
  Administered 2019-01-24: 300 mg via ORAL
  Filled 2019-01-24: qty 1

## 2019-01-24 MED ORDER — BUTALBITAL-APAP-CAFFEINE 50-325-40 MG PO TABS
1.0000 | ORAL_TABLET | Freq: Four times a day (QID) | ORAL | Status: DC | PRN
  Administered 2019-01-24: 1 via ORAL
  Filled 2019-01-24: qty 1

## 2019-01-24 NOTE — Progress Notes (Signed)
Discharge instructions given with stated understanding.  Patient waiting for transportation home to arrive.

## 2019-01-24 NOTE — Plan of Care (Signed)

## 2019-01-26 LAB — CULTURE, BLOOD (ROUTINE X 2): Culture: NO GROWTH

## 2019-01-28 NOTE — Discharge Summary (Signed)
[facility] Discharge Summary   Patient: Mary [name] WUJ:[id number]   PCP: [facility], [facility] Public DOB: Nov 04, 1970   Date of admission: 01/22/2019   Date of discharge: 01/24/2019     Discharge Diagnoses:  Principal Problem:   Acute pyelonephritis Active Problems:   Bacteremia due to Gram-negative bacteria   GERD (gastroesophageal reflux disease)   Hypercholesterolemia   Type 2 diabetes mellitus (HCC)   Hypophosphatemia   Hypokalemia   Transaminitis   NASH (nonalcoholic steatohepatitis)   Admitted From: home Disposition:  Home   Recommendations for Outpatient Follow-up:  1. Please follow up with PCP in 1 week   Follow-up Information    [facility], [facility]. Schedule an appointment as soon as possible for a visit in 1 week(s).   Contact information: [address] [place] [address] [phone]          Diet recommendation: cardiac diet  Activity: The patient is advised to gradually reintroduce usual activities,as tolerated .  Discharge Condition: good  Code Status: full code  History of present illness: As per the H and P dictated on admission, "Germany Dungee is a 48 y.o. female with medical history significant of anxiety, type 2 diabetes, GERD, hyperlipidemia who was seen yesterday in the emergency department due to UTI and returns today after blood culture reveal gram-negative bacteria.  She complains of urgency, suprapubic and bilateral flank tenderness more intense on the left.  She denies fever, but complains of chills, mild fatigue and malaise.  Mild decrease in appetite, but denies sore throat, rhinorrhea, dyspnea, chest pain, palpitations, dizziness, lower extremity edema, nausea, vomiting, diarrhea, melena or hematochezia.  Denies polyuria, polydipsia, polyphagia or blurred vision.  She also complains of a left frontal headache that she developed after hitting the area about 3 weeks ago.  There was  no vomiting, blurred vision or somnolence following this.  ED Course: Vital signs were normal.  The patient was given fluids and ceftriaxone in the emergency department.  Her urinalysis showed large hemoglobinuria, rare bacteria, ketonuria 20 mg/dL.  CBC with than expected values.  CMP shows a potassium of 3.0 mmol/L.  Glucose at 224 mg/dL.  AST 56, ALT 73 and alkaline phosphatase 158 units/L.  All other values including renal function and bilirubin within normal parameters.  Lactic acid was normal.  Phosphorus 1.9 and magnesium 2.0 mg/dL."  Hospital Course:  Summary of her active problems in the hospital is as following. 1-acute E. coli pyelonephritis and bacteremia -Patient currently afebrile -Reports intermittent nausea but no vomiting Currently no nausea no vomiting no abdominal pain. Treated with IV ceftriaxone. We will transition to oral cefdinir. CT renal stone negative for any acute stone.  If the patient has further episode of UTI will require urology follow-up.  2-GERD (gastroesophageal reflux disease) -Continue PPI  3-Hypercholesterolemia LFTs improving.  Resume home medication  4-hyperglycemia, diabetes ruled out. -Not on medications prior to admission Hemoglobin A1c 5.9.  No diabetes.  No further changes needed.  5-Transaminitis -in the setting of acute infection Vs NASH -Resolved.  6-hypokalemia -replaced   7-migraine -will give depakote, reglan and benadryl cocktail -follow improvement in HA -continue PRN tylenol  Patient was ambulatory without any assistance. On the day of the discharge the patient's vitals were stable , and no other acute medical condition were reported by patient. the patient was felt safe to be discharge at Home.  Consultants: none Procedures: none  DISCHARGE MEDICATION: Allergies as of 01/24/2019      Reactions  Oxycodone Nausea Only      Medication List    STOP taking these medications   cephALEXin 500 MG  capsule Commonly known as: KEFLEX   ibuprofen 400 MG tablet Commonly known as: ADVIL     TAKE these medications   butalbital-acetaminophen-caffeine 50-325-40 MG tablet Commonly known as: FIORICET Take 1 tablet by mouth every 6 (six) hours as needed for headache or migraine.   cefdinir 300 MG capsule Commonly known as: OMNICEF Take 1 capsule (300 mg total) by mouth 2 (two) times daily for 10 days.   Fish Oil 1000 MG Caps Take 1 capsule by mouth daily.   lovastatin 20 MG tablet Commonly known as: MEVACOR Take 40 mg by mouth at bedtime.      Allergies  Allergen Reactions   Oxycodone Nausea Only   Discharge Instructions    Diet - low sodium heart healthy   Complete by: As directed    Discharge instructions   Complete by: As directed    It is important that you read the given instructions as well as go over your medication list with RN to help you understand your care after this hospitalization.  Discharge Instructions: Please follow-up with PCP in 1-2 weeks  Please request your primary care physician to go over all Hospital Tests and Procedure/Radiological results at the follow up. Please get all Hospital records sent to your PCP by signing hospital release before you go home.   Do not drive, operating heavy machinery, perform activities at heights, swimming or participation in water activities or provide baby sitting services while you are on Pain, Sleep and Anxiety Medications; until you have been seen by Primary Care Physician or a Neurologist and advised to do so again. Do not take more than prescribed Pain, Sleep and Anxiety Medications. You were cared for by a hospitalist during your hospital stay. If you have any questions about your discharge medications or the care you received while you were in the hospital after you are discharged, you can call the unit @UNIT @ you were admitted to and ask to speak with the hospitalist on call if the hospitalist that took care of  you is not available.  Once you are discharged, your primary care physician will handle any further medical issues. Please note that NO REFILLS for any discharge medications will be authorized once you are discharged, as it is imperative that you return to your primary care physician (or establish a relationship with a primary care physician if you do not have one) for your aftercare needs so that they can reassess your need for medications and monitor your lab values. You Must read complete instructions/literature along with all the possible adverse reactions/side effects for all the Medicines you take and that have been prescribed to you. Take any new Medicines after you have completely understood and accept all the possible adverse reactions/side effects. Wear Seat belts while driving. If you have smoked or chewed Tobacco in the last 2 yrs please stop smoking and/or stop any Recreational drug use.  If you drink alcohol, please moderate the use and do not drive, operating heavy machinery, perform activities at heights, swimming or participation in water activities or provide baby sitting services under influence.   Increase activity slowly   Complete by: As directed      Discharge Exam: Filed Weights   01/22/19 1959  Weight: 63 kg   Vitals:   01/23/19 2106 01/24/19 0504  BP: 104/65 102/68  Pulse: 80 79  Resp: 16  16  Temp: 99.5 F (37.5 C) 98.4 F (36.9 C)  SpO2: 99% 99%   General: Appear in mild distress, no Rash; Oral Mucosa Clear, moist. no Abnormal Mass Or lumps Cardiovascular: S1 and S2 Present, no Murmur, Respiratory: normal respiratory effort, Bilateral Air entry present and Clear to Auscultation, no Crackles, no wheezes Abdomen: Bowel Sound present, Soft and no tenderness, no hernia Extremities: no Pedal edema, no calf tenderness Neurology: alert and oriented to time, place, and person affect appropriate. normal without focal findings, mental status, speech normal, alert and  oriented x3, PERLA, Motor strength 5/5 and symmetric and sensation grossly normal to light touch   The results of significant diagnostics from this hospitalization (including imaging, microbiology, ancillary and laboratory) are listed below for reference.    Significant Diagnostic Studies: Ct Head Wo Contrast  Result Date: 01/21/2019 CLINICAL DATA:  Headaches since hitting head on a wall 3 weeks ago while cleaning. EXAM: CT HEAD WITHOUT CONTRAST TECHNIQUE: Contiguous axial images were obtained from the base of the skull through the vertex without intravenous contrast. COMPARISON:  01/23/2016 FINDINGS: Brain: There is no evidence of acute infarct, intracranial hemorrhage, mass, midline shift, or extra-axial fluid collection. The ventricles and sulci are normal. Vascular: No hyperdense vessel. Skull: No fracture or focal osseous lesion. Sinuses/Orbits: Visualized paranasal sinuses are clear. There may be trace mastoid fluid bilaterally. The orbits are unremarkable. Other: None. IMPRESSION: Negative head CT. Electronically Signed   By: Sebastian AcheAllen  Grady M.D.   On: 01/21/2019 09:51   Ct Renal Stone Study  Result Date: 01/21/2019 CLINICAL DATA:  Bilateral flank pain EXAM: CT ABDOMEN AND PELVIS WITHOUT CONTRAST TECHNIQUE: Multidetector CT imaging of the abdomen and pelvis was performed following the standard protocol without IV contrast. COMPARISON:  03/12/2015 FINDINGS: Lower chest: Minor dependent basilar atelectasis. Stable calcified right lower lobe granuloma measuring 5 mm, image 32 series 4. No lower lobe pneumonia. Normal heart size. No pericardial pleural effusion. Hepatobiliary: No focal liver abnormality is seen. No gallstones, gallbladder wall thickening, or biliary dilatation. Pancreas: Unremarkable. No pancreatic ductal dilatation or surrounding inflammatory changes. Spleen: Normal in size without focal abnormality. Adrenals/Urinary Tract: Normal adrenal glands. Left kidney and ureter demonstrate minor  perinephric and periureteral strandy edema proximally. Slight pelviectasis of the left kidney. Left ureter does not demonstrate any radiopaque obstructing calculus. No UVJ abnormality. Appearance compatible with recent stone passage versus ascending urinary tract infection or pyelonephritis. Correlate with urinalysis. Right kidney and ureter are unremarkable without acute process or obstruction. Bladder is underdistended. Stomach/Bowel: Negative for bowel obstruction, significant dilatation, ileus, or free air. Normal appendix identified. No free fluid, fluid collection, hemorrhage, hematoma, abscess, or ascites. Vascular/Lymphatic: Negative for aneurysm. No significant atherosclerosis. Retroaortic left renal vein noted, normal variant. No adenopathy. Reproductive: Uterus and bilateral adnexa are unremarkable. Other: No abdominal wall hernia or abnormality. No abdominopelvic ascites. Musculoskeletal: No acute osseous finding. IMPRESSION: Mild left perinephric and proximal periureteral strandy edema/inflammation without associated radiopaque obstructing stone identified. Suspect recent stone passage versus ascending urinary tract infection or pyelonephritis. No other acute intra-abdominopelvic finding by noncontrast CT. Electronically Signed   By: Judie PetitM.  Shick M.D.   On: 01/21/2019 10:34   [place] Abdomen Limited Ruq  Result Date: 01/23/2019 CLINICAL DATA:  Nausea and transaminitis. EXAM: ULTRASOUND ABDOMEN LIMITED RIGHT UPPER QUADRANT COMPARISON:  CT dated 01/21/2019 FINDINGS: Gallbladder: There is a 0.4 cm gallbladder polyp. The sonographic Eulah PontMurphy sign is negative. The gallbladder wall is not overtly thickened and measures approximately 2 mm in thickness. Per consensus  statement, gallbladder polyps less than 6 mm are usually benign not requiring follow-up There is no pericholecystic free fluid. Common bile duct: Diameter: 0.3 cm Liver: Diffuse increased echogenicity with slightly heterogeneous liver. Appearance  typically secondary to fatty infiltration. Fibrosis secondary consideration. No secondary findings of cirrhosis noted. No focal hepatic lesion or intrahepatic biliary duct dilatation. Portal vein is patent on color Doppler imaging with normal direction of blood flow towards the liver. IMPRESSION: 1. No sonographic evidence for acute cholecystitis. No gallstones identified. 2. Diffuse increased echogenicity with slightly heterogeneous liver. Appearance typically secondary to fatty infiltration. Fibrosis secondary consideration. No secondary findings of cirrhosis noted. No focal hepatic lesion or intrahepatic biliary duct dilatation. Electronically Signed   By: Katherine Mantle M.D.   On: 01/23/2019 15:51    Microbiology: Recent Results (from the past 240 hour(s))  Urine Culture     Status: Abnormal   Collection Time: 01/21/19 11:09 AM   Specimen: Urine, Clean Catch  Result Value Ref Range Status   Specimen Description   Final    URINE, CLEAN CATCH Performed at [facility], [address] [place], [place] [address]    Special Requests   Final    NONE Performed at [facility], [address] [place], [place] [address]    Culture >=100,000 COLONIES/mL ESCHERICHIA COLI (A)  Final   Report Status 01/24/2019 FINAL  Final   Organism ID, Bacteria ESCHERICHIA COLI (A)  Final      Susceptibility   Escherichia coli - MIC*    AMPICILLIN <=2 SENSITIVE Sensitive     CEFAZOLIN <=4 SENSITIVE Sensitive     CEFTRIAXONE <=1 SENSITIVE Sensitive     CIPROFLOXACIN <=0.25 SENSITIVE Sensitive     GENTAMICIN <=1 SENSITIVE Sensitive     IMIPENEM <=0.25 SENSITIVE Sensitive     NITROFURANTOIN <=16 SENSITIVE Sensitive     TRIMETH/SULFA <=20 SENSITIVE Sensitive     AMPICILLIN/SULBACTAM <=2 SENSITIVE Sensitive     PIP/TAZO <=4 SENSITIVE Sensitive     Extended ESBL NEGATIVE Sensitive     * >=100,000 COLONIES/mL ESCHERICHIA COLI  Blood culture (routine x 2)     Status: Abnormal   Collection Time: 01/21/19 11:20  AM   Specimen: BLOOD RIGHT HAND  Result Value Ref Range Status   Specimen Description   Final    BLOOD RIGHT HAND BOTTLES DRAWN AEROBIC AND ANAEROBIC Performed at [facility], [address], [place], [place] [address]    Special Requests   Final    Blood Culture adequate volume Performed at [facility], [address], [place], [place] [address]    Culture  Setup Time   Final    GRAM NEGATIVE RODS Gram Stain Report Called to,Read Back By and Verified With: CRABTREE,B. AT 0018 ON 01/22/2019 BY EVA ANAEROBIC BOTTLE ONLY Performed at [facility] CRITICAL RESULT CALLED TO, READ BACK BY AND VERIFIED WITH: RN C MAYNARD  01/22/19 BY S GEZAHEGN Performed at [facility], [address] [place], [place] [address]    Culture ESCHERICHIA COLI (A)  Final   Report Status 01/24/2019 FINAL  Final   Organism ID, Bacteria ESCHERICHIA COLI  Final      Susceptibility   Escherichia coli - MIC*    AMPICILLIN <=2 SENSITIVE Sensitive     CEFAZOLIN <=4 SENSITIVE Sensitive     CEFEPIME <=1 SENSITIVE Sensitive     CEFTAZIDIME <=1 SENSITIVE Sensitive     CEFTRIAXONE <=1 SENSITIVE Sensitive     CIPROFLOXACIN <=0.25 SENSITIVE Sensitive  GENTAMICIN <=1 SENSITIVE Sensitive     IMIPENEM <=0.25 SENSITIVE Sensitive     TRIMETH/SULFA <=20 SENSITIVE Sensitive     AMPICILLIN/SULBACTAM <=2 SENSITIVE Sensitive     PIP/TAZO <=4 SENSITIVE Sensitive     Extended ESBL NEGATIVE Sensitive     * ESCHERICHIA COLI  Blood Culture ID Panel (Reflexed)     Status: Abnormal   Collection Time: 01/21/19 11:20 AM  Result Value Ref Range Status   Enterococcus species NOT DETECTED NOT DETECTED Final   Listeria monocytogenes NOT DETECTED NOT DETECTED Final   Staphylococcus species NOT DETECTED NOT DETECTED Final   Staphylococcus aureus (BCID) NOT DETECTED NOT DETECTED Final   Streptococcus species NOT DETECTED NOT DETECTED Final   Streptococcus agalactiae NOT DETECTED NOT DETECTED Final   Streptococcus  pneumoniae NOT DETECTED NOT DETECTED Final   Streptococcus pyogenes NOT DETECTED NOT DETECTED Final   Acinetobacter baumannii NOT DETECTED NOT DETECTED Final   Enterobacteriaceae species DETECTED (A) NOT DETECTED Final    Comment: Enterobacteriaceae represent a large family of gram-negative bacteria, not a single organism. CRITICAL RESULT CALLED TO, READ BACK BY AND VERIFIED WITH: RN C MAYNARD @0326  01/22/19 BY S GEZAHEGN    Enterobacter cloacae complex NOT DETECTED NOT DETECTED Final   Escherichia coli DETECTED (A) NOT DETECTED Final    Comment: CRITICAL RESULT CALLED TO, READ BACK BY AND VERIFIED WITH: RN C MAYNARD @0326  01/22/19 BY S GEZAHEGN    Klebsiella oxytoca NOT DETECTED NOT DETECTED Final   Klebsiella pneumoniae NOT DETECTED NOT DETECTED Final   Proteus species NOT DETECTED NOT DETECTED Final   Serratia marcescens NOT DETECTED NOT DETECTED Final   Carbapenem resistance NOT DETECTED NOT DETECTED Final   Haemophilus influenzae NOT DETECTED NOT DETECTED Final   Neisseria meningitidis NOT DETECTED NOT DETECTED Final   Pseudomonas aeruginosa NOT DETECTED NOT DETECTED Final   Candida albicans NOT DETECTED NOT DETECTED Final   Candida glabrata NOT DETECTED NOT DETECTED Final   Candida krusei NOT DETECTED NOT DETECTED Final   Candida parapsilosis NOT DETECTED NOT DETECTED Final   Candida tropicalis NOT DETECTED NOT DETECTED Final    Comment: Performed at [facility], [address] [place], [place] [address]  Blood culture (routine x 2)     Status: None   Collection Time: 01/21/19 11:27 AM   Specimen: BLOOD LEFT ARM  Result Value Ref Range Status   Specimen Description BLOOD LEFT ARM BOTTLES DRAWN AEROBIC AND ANAEROBIC  Final   Special Requests   Final    Blood Culture results may not be optimal due to an excessive volume of blood received in culture bottles   Culture   Final    NO GROWTH 5 DAYS Performed at [facility], [address] [place], [place] [address]     Report Status 01/26/2019 FINAL  Final  Novel Coronavirus,NAA,(SEND-OUT TO REF LAB - TAT 24-48 hrs); Hosp Order     Status: None   Collection Time: 01/22/19 10:18 PM   Specimen: Nasopharyngeal Swab; Respiratory  Result Value Ref Range Status   SARS-CoV-2, NAA NOT DETECTED NOT DETECTED Final    Comment: (NOTE) This test was developed and its performance characteristics determined by [facility]. This test has not been FDA cleared or approved. This test has been authorized by FDA under an Emergency Use Authorization (EUA). This test is only authorized for the duration of time the declaration that circumstances exist justifying the authorization of the emergency use of in vitro diagnostic tests for detection  of SARS-CoV-2 virus and/or diagnosis of COVID-19 infection under section 564(b)(1) of the Act, 21 U.S.C. 268TMH-9(Q)(2), unless the authorization is terminated or revoked sooner. When diagnostic testing is negative, the possibility of a false negative result should be considered in the context of a patient's recent exposures and the presence of clinical signs and symptoms consistent with COVID-19. An individual without symptoms of COVID-19 and who is not shedding SARS-CoV-2 virus would expect to have a negative (not detected) result in this assay. Performed  At: [facility] [place], [place] [id number] Rush Farmer MD JH:[id number]    [place]  Final    Comment: Performed at [facility], [address], [place], [place] [address]     Labs: CBC: Recent Labs  Lab 01/22/19 2117 01/24/19 0510  WBC 8.6 7.7  NEUTROABS 6.9  --   HGB 12.3 11.0*  HCT 36.9 33.9*  MCV 93.2 94.2  PLT 151 149   Basic Metabolic Panel: Recent Labs  Lab 01/22/19 2117 01/23/19 0452 01/24/19 0510  NA 140 139 140  K 3.0* 3.8 3.7  CL 105 111 108  CO2 24 23 22   GLUCOSE 124* 114* 102*  BUN 8 6 7   CREATININE 0.51 0.38* 0.41*  CALCIUM 9.1  8.0* 9.1  MG 2.0  --   --   PHOS 1.9*  --   --    Liver Function Tests: Recent Labs  Lab 01/22/19 2117 01/23/19 0452 01/24/19 0510  AST 56* 44* 41  ALT 73* 56* 70*  ALKPHOS 158* 130* 190*  BILITOT 0.8 0.6 0.5  PROT 7.5 5.9* 6.9  ALBUMIN 3.8 2.9* 3.3*   No results for input(s): LIPASE, AMYLASE in the last 168 hours. No results for input(s): AMMONIA in the last 168 hours. Cardiac Enzymes: No results for input(s): CKTOTAL, CKMB, CKMBINDEX, TROPONINI in the last 168 hours. BNP (last 3 results) No results for input(s): BNP in the last 8760 hours. CBG: Recent Labs  Lab 01/23/19 1117 01/23/19 1609 01/23/19 2107 01/24/19 0729 01/24/19 1108  GLUCAP 120* 145* 97 89 145*   Time spent: 35 minutes  Signed:  Berle Mull  [facility] 01/24/2019

## 2019-02-27 ENCOUNTER — Other Ambulatory Visit (HOSPITAL_COMMUNITY): Payer: Self-pay | Admitting: *Deleted

## 2019-02-27 DIAGNOSIS — Z1231 Encounter for screening mammogram for malignant neoplasm of breast: Secondary | ICD-10-CM

## 2019-03-14 ENCOUNTER — Ambulatory Visit (HOSPITAL_COMMUNITY)
Admission: RE | Admit: 2019-03-14 | Discharge: 2019-03-14 | Disposition: A | Discharge status: Home / Self Care | Payer: PRIVATE HEALTH INSURANCE | Source: Ambulatory Visit | Attending: *Deleted | Admitting: *Deleted

## 2019-03-14 ENCOUNTER — Other Ambulatory Visit: Payer: Self-pay

## 2019-03-14 DIAGNOSIS — Z1231 Encounter for screening mammogram for malignant neoplasm of breast: Secondary | ICD-10-CM | POA: Diagnosis present

## 2019-07-13 IMAGING — CT CT HEAD W/O CM
3 series · 15 of 46 positions shown, 18 images · non-contrast
Comparison: None.

CLINICAL DATA: Dizziness with headache and nausea

EXAM:
CT HEAD WITHOUT CONTRAST
TECHNIQUE: Contiguous axial images were obtained from the base of the skull
through the vertex without intravenous contrast.

[Series 2: head wo · axial · 0.47mm/px · z∈[+87,+207]mm · 9 of 29 slices shown, 12 images]
[im 3/29  brain]
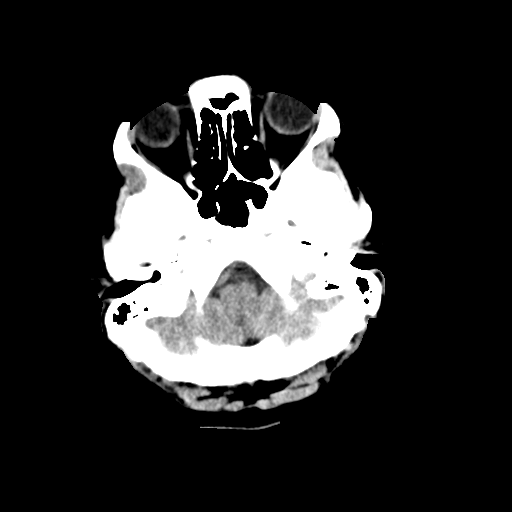
[im 3/29  bone]
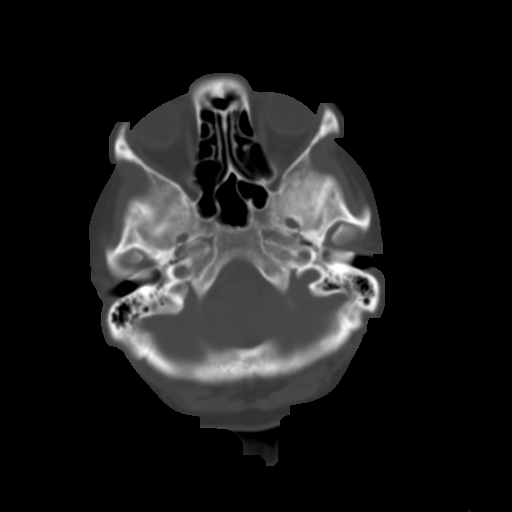
[im 6/29  brain]
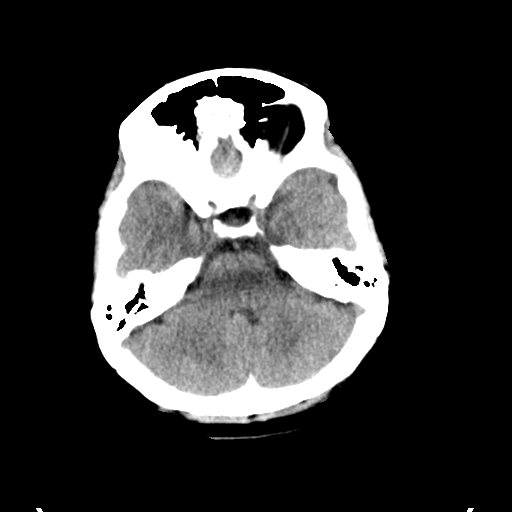
[im 9/29  brain]
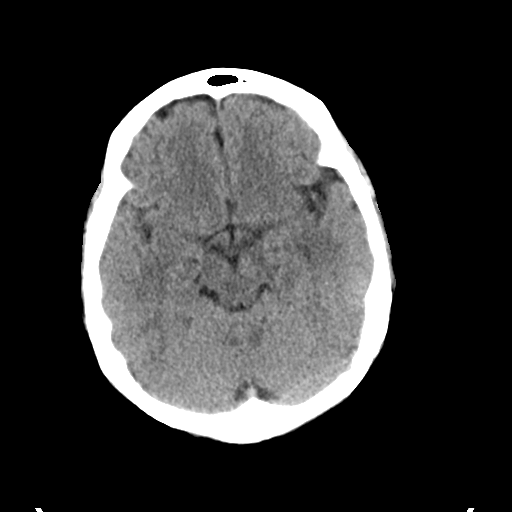
[im 12/29  brain]
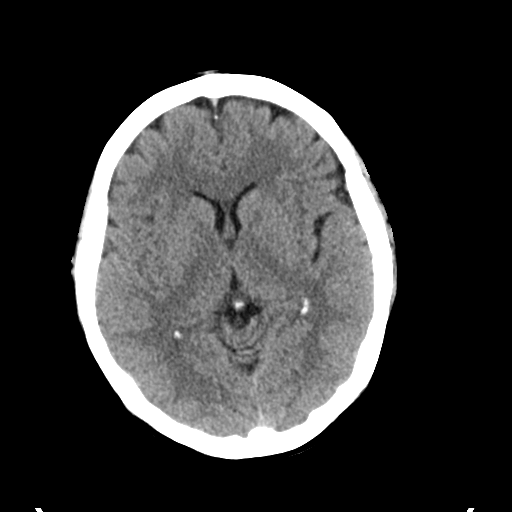
[im 15/29  brain]
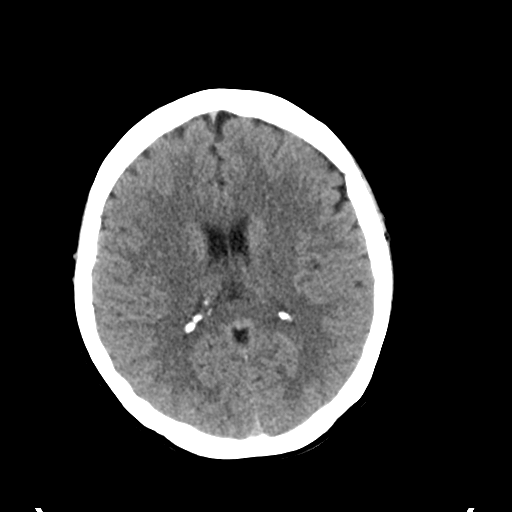
[im 15/29  bone]
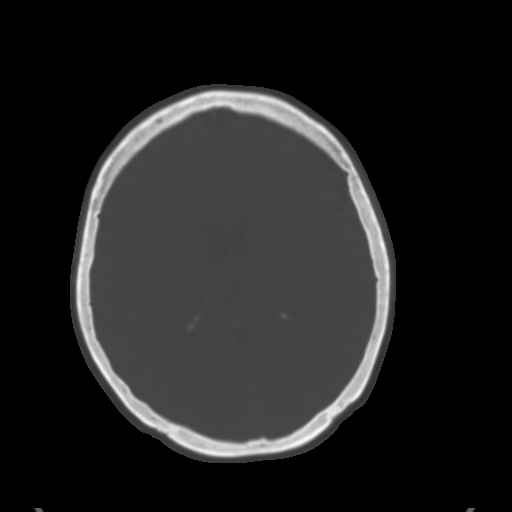
[im 18/29  brain]
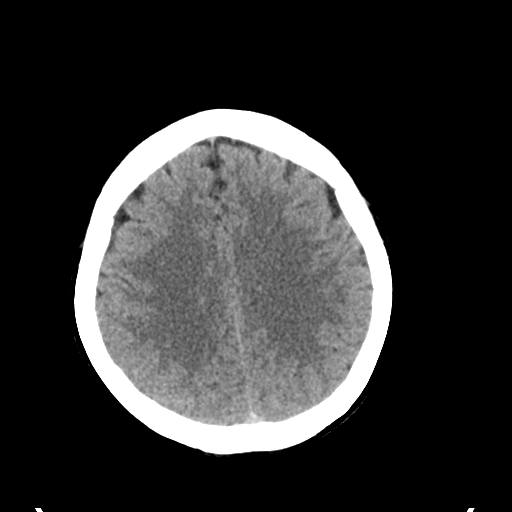
[im 21/29  brain]
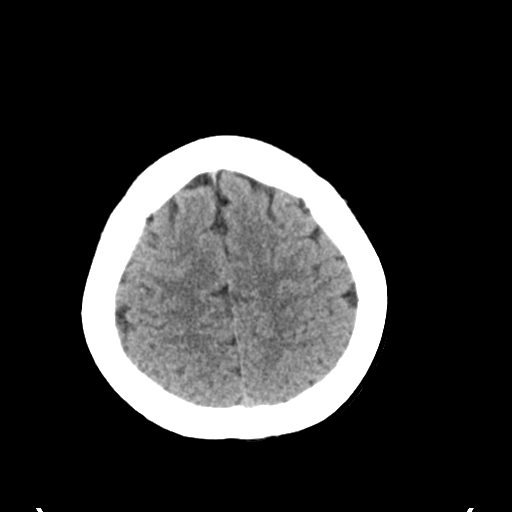
[im 24/29  brain]
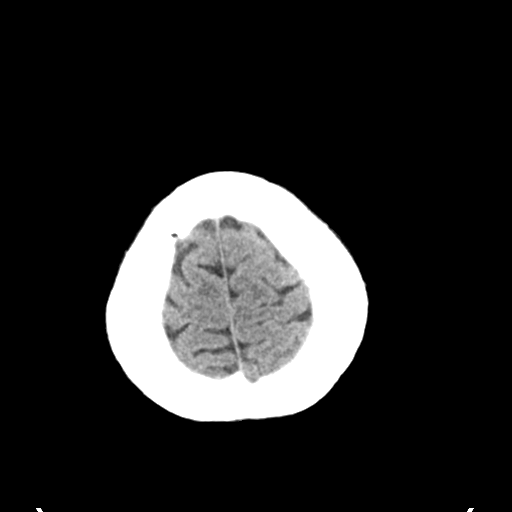
[im 27/29  brain]
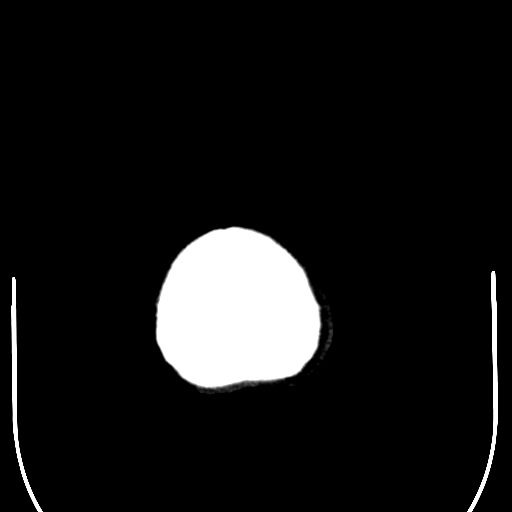
[im 27/29  bone]
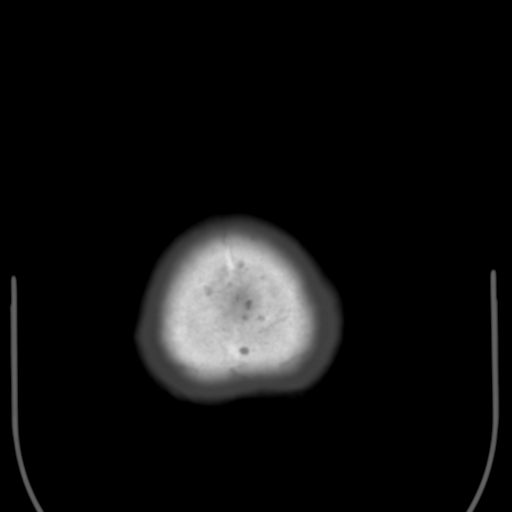

[Series 4: coronal soft tissue · coronal · 0.29mm/px · 3 of 67 slices shown]
[im 23/67  brain]
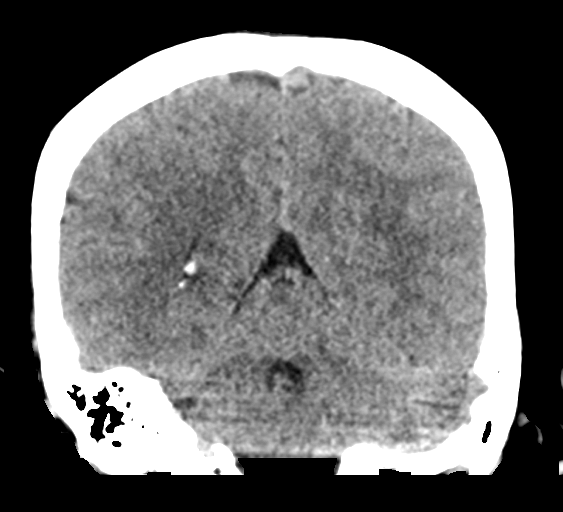
[im 30/67  brain]
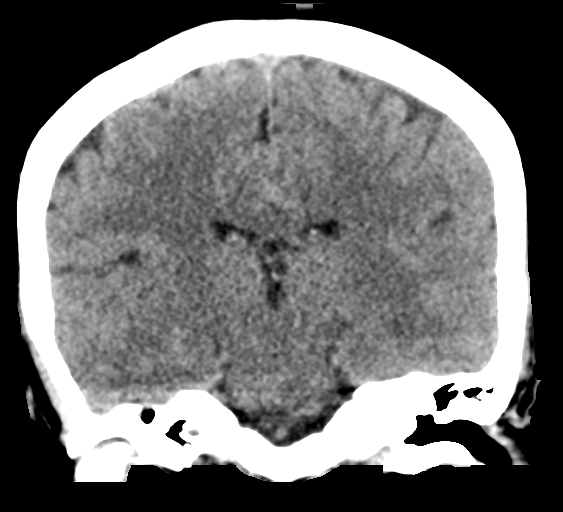
[im 37/67  brain]
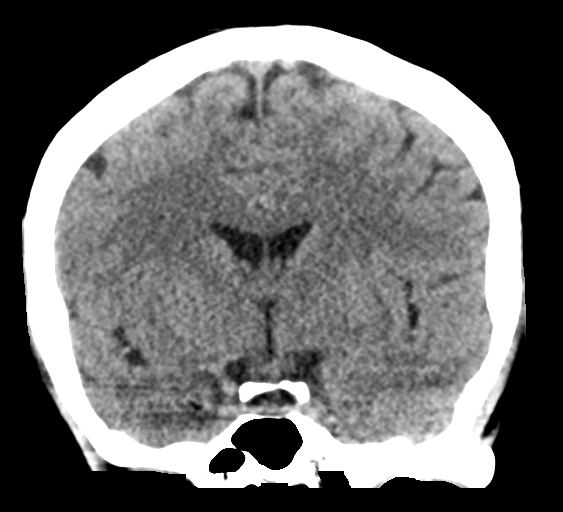

[Series 5: sagittal soft tissue · sagittal · 0.28mm/px · 3 of 53 slices shown]
[im 18/53  brain]
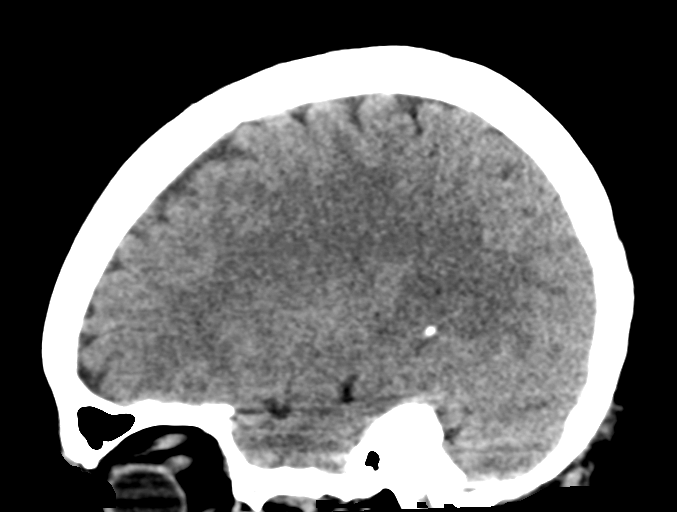
[im 27/53  brain]
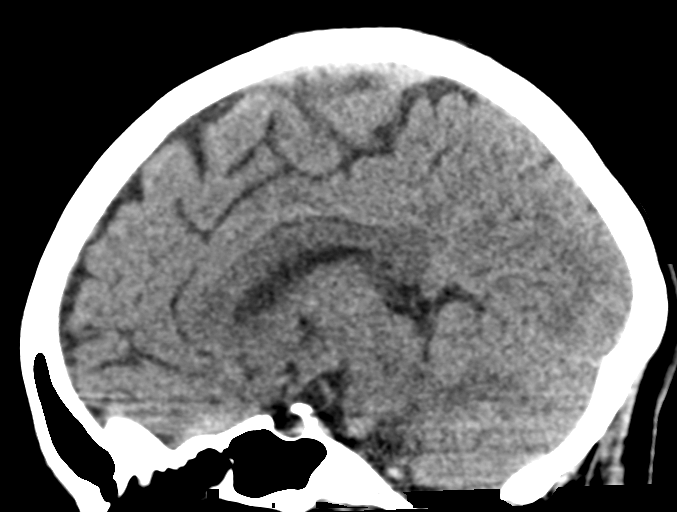
[im 35/53  brain]
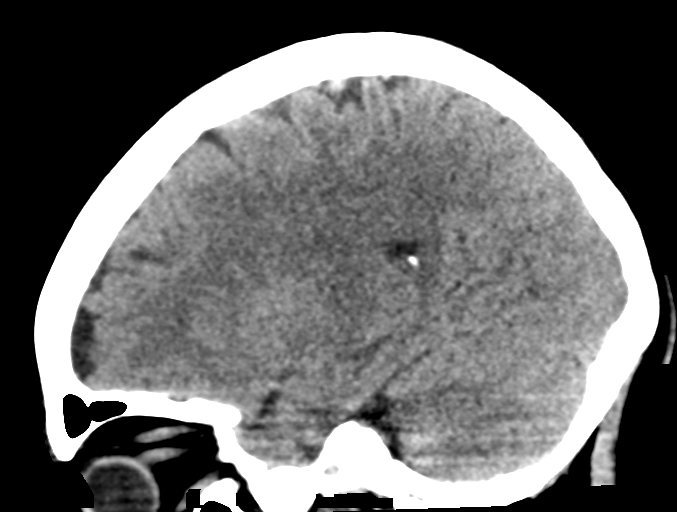

[15 of 46 positions shown; findings below may reference images not displayed]

FINDINGS: Brain: The ventricles are normal in size and configuration. There is
no intracranial mass, hemorrhage, extra-axial fluid collection, or
midline shift. The gray-white compartments appear normal. No evident
acute infarct.

Vascular: No hyperdense vessel.  No abnormal vascular calcification.

Skull: The bony calvarium appears intact.

Sinuses/Orbits: Mucosal thickening is noted in several ethmoid air
cells. Other visualized paranasal sinuses are clear. Visualized
orbits appear symmetric bilaterally.

Other: Mastoid air cells are clear.
IMPRESSION: There is mucosal thickening in several ethmoid air cells. Study
otherwise unremarkable.

## 2019-09-07 ENCOUNTER — Telehealth: Payer: Self-pay | Admitting: Urology

## 2019-09-07 MED ORDER — NITROFURANTOIN MONOHYD MACRO 100 MG PO CAPS: 100.0000 mg | ORAL_CAPSULE | Freq: Every day | ORAL | 0 refills | Status: DC

## 2019-09-07 MED ORDER — NITROFURANTOIN MONOHYD MACRO 100 MG PO CAPS: 100.0000 mg | ORAL_CAPSULE | Freq: Every day | ORAL | 2 refills | Status: AC

## 2019-09-07 NOTE — Telephone Encounter (Signed)
Error

## 2019-09-07 NOTE — Telephone Encounter (Signed)
Meds to pharmacy

## 2020-06-10 ENCOUNTER — Other Ambulatory Visit (HOSPITAL_COMMUNITY): Payer: Self-pay | Admitting: *Deleted

## 2020-06-10 DIAGNOSIS — Z1231 Encounter for screening mammogram for malignant neoplasm of breast: Secondary | ICD-10-CM

## 2020-07-28 ENCOUNTER — Ambulatory Visit (HOSPITAL_COMMUNITY)
Admission: RE | Admit: 2020-07-28 | Discharge: 2020-07-28 | Disposition: A | Discharge status: Home / Self Care | Payer: PRIVATE HEALTH INSURANCE | Source: Ambulatory Visit | Attending: *Deleted | Admitting: *Deleted

## 2020-07-28 ENCOUNTER — Other Ambulatory Visit: Payer: Self-pay

## 2020-07-28 DIAGNOSIS — Z1231 Encounter for screening mammogram for malignant neoplasm of breast: Secondary | ICD-10-CM | POA: Insufficient documentation

## 2021-05-01 ENCOUNTER — Other Ambulatory Visit (HOSPITAL_COMMUNITY): Payer: Self-pay | Admitting: Obstetrics and Gynecology

## 2021-05-01 DIAGNOSIS — Z1231 Encounter for screening mammogram for malignant neoplasm of breast: Secondary | ICD-10-CM

## 2021-05-15 ENCOUNTER — Ambulatory Visit (HOSPITAL_COMMUNITY): Payer: Self-pay

## 2021-08-14 ENCOUNTER — Ambulatory Visit (HOSPITAL_COMMUNITY): Payer: Self-pay

## 2021-08-14 ENCOUNTER — Inpatient Hospital Stay (HOSPITAL_COMMUNITY): Admission: RE | Admit: 2021-08-14 | Payer: Self-pay | Source: Ambulatory Visit

## 2021-08-17 ENCOUNTER — Other Ambulatory Visit (HOSPITAL_COMMUNITY): Payer: Self-pay | Admitting: *Deleted

## 2021-08-17 DIAGNOSIS — Z1231 Encounter for screening mammogram for malignant neoplasm of breast: Secondary | ICD-10-CM

## 2021-08-20 ENCOUNTER — Encounter: Payer: Self-pay | Admitting: *Deleted

## 2021-08-28 ENCOUNTER — Inpatient Hospital Stay (HOSPITAL_COMMUNITY): Payer: Self-pay | Attending: Obstetrics and Gynecology

## 2021-08-28 ENCOUNTER — Ambulatory Visit (HOSPITAL_COMMUNITY): Payer: Self-pay

## 2021-08-31 ENCOUNTER — Ambulatory Visit (HOSPITAL_COMMUNITY)
Admission: RE | Admit: 2021-08-31 | Discharge: 2021-08-31 | Disposition: A | Discharge status: Home / Self Care | Payer: Self-pay | Source: Ambulatory Visit | Attending: *Deleted | Admitting: *Deleted

## 2021-08-31 ENCOUNTER — Other Ambulatory Visit: Payer: Self-pay

## 2021-08-31 DIAGNOSIS — Z1231 Encounter for screening mammogram for malignant neoplasm of breast: Secondary | ICD-10-CM | POA: Insufficient documentation

## 2021-09-18 ENCOUNTER — Emergency Department (HOSPITAL_COMMUNITY)
Admission: EM | Admit: 2021-09-18 | Discharge: 2021-09-18 | Disposition: A | Discharge status: Home / Self Care | Payer: Self-pay | Attending: Emergency Medicine | Admitting: Emergency Medicine

## 2021-09-18 ENCOUNTER — Encounter (HOSPITAL_COMMUNITY): Payer: Self-pay | Admitting: Emergency Medicine

## 2021-09-18 ENCOUNTER — Emergency Department (HOSPITAL_COMMUNITY): Payer: Self-pay

## 2021-09-18 DIAGNOSIS — R079 Chest pain, unspecified: Secondary | ICD-10-CM | POA: Insufficient documentation

## 2021-09-18 DIAGNOSIS — R519 Headache, unspecified: Secondary | ICD-10-CM | POA: Insufficient documentation

## 2021-09-18 DIAGNOSIS — E119 Type 2 diabetes mellitus without complications: Secondary | ICD-10-CM | POA: Insufficient documentation

## 2021-09-18 LAB — BASIC METABOLIC PANEL
Anion gap: 6 (ref 5–15)
BUN: 9 mg/dL (ref 6–20)
CO2: 27 mmol/L (ref 22–32)
Calcium: 9.5 mg/dL (ref 8.9–10.3)
Chloride: 106 mmol/L (ref 98–111)
Creatinine, Ser: 0.46 mg/dL (ref 0.44–1.00)
GFR, Estimated: >60 mL/min (ref >60)
Glucose, Bld: 111 mg/dL — ABNORMAL HIGH (ref 70–99)
Potassium: 4.1 mmol/L (ref 3.5–5.1)
Sodium: 139 mmol/L (ref 135–145)

## 2021-09-18 LAB — CBC
HCT: 39.3 % (ref 36.0–46.0)
Hemoglobin: 13.1 g/dL (ref 12.0–15.0)
MCH: 31.3 pg (ref 26.0–34.0)
MCHC: 33.3 g/dL (ref 30.0–36.0)
MCV: 94 fL (ref 80.0–100.0)
Platelets: 182 10*3/uL (ref 150–400)
RBC: 4.18 MIL/uL (ref 3.87–5.11)
RDW: 12.3 % (ref 11.5–15.5)
WBC: 6.3 10*3/uL (ref 4.0–10.5)
nRBC: 0 % (ref 0.0–0.2)

## 2021-09-18 LAB — TROPONIN I (HIGH SENSITIVITY): Troponin I (High Sensitivity): <2 ng/L (ref <18)

## 2021-09-18 NOTE — Discharge Instructions (Signed)
You were evaluated in the Emergency Department and after careful evaluation, we did not find any emergent condition requiring admission or further testing in the hospital.  Your exam/testing today was overall reassuring.  Please return to the Emergency Department if you experience any worsening of your condition.  Thank you for allowing us to be a part of your care.  

## 2021-09-18 NOTE — ED Triage Notes (Signed)
Pt c/o left sided chest pain and headache for the past 2 days. Pt states she took medication for headache and sleep but it didn't work Midwife.

## 2021-09-18 NOTE — ED Provider Notes (Signed)
[facility]-EMERGENCY DEPT [facility] Emergency Department Provider Note MRN:  [id number]  Arrival date & time: 09/18/21     Chief Complaint   Chest Pain   History of Present Illness   Mary [name] is a 51 y.o. year-old female with a history of DM presenting to the ED with chief complaint of chest pain.  Intermittent chest pain and headache over the past week.  Has happened before.  Pain feels mild like a fluttering in the chest.  Thinks maybe it is anxiety.  No SOB.    Review of Systems  A thorough review of systems was obtained and all systems are negative except as noted in the HPI and PMH.   Patient's Health History    Past Medical History:  Diagnosis Date   Anxiety    Diabetes mellitus without complication (HCC)    pre diabetic   GERD (gastroesophageal reflux disease)    Hypercholesterolemia     Past Surgical History:  Procedure Laterality Date   BREAST SURGERY Right 2012   small lump in breast removed   BREAST SURGERY     right breast biopsy 2010   CESAREAN SECTION     3 c-section    three c sections     TONSILLECTOMY  04/21/2012   Procedure: TONSILLECTOMY;  Surgeon: Drema Halon, MD;  Location: [facility];  Service: ENT;  Laterality: N/A;  tonsillectomy   TUBAL LIGATION      Family History  Problem Relation Age of Onset   Diabetes Mother        deceased   Diabetes Sister        deceased   Cancer Sister 54       ovarian    Diabetes Brother    Diabetes Sister    Diabetes Sister     Social History   Socioeconomic History   Marital status: Single    Spouse name: Not on file   Number of children: Not on file   Years of education: Not on file   Highest education level: Not on file  Occupational History   Not on file  Tobacco Use   Smoking status: Never   Smokeless tobacco: Never  Vaping Use   Vaping Use: Never used  Substance and Sexual Activity   Alcohol use: Not Currently   Drug use: No   Sexual  activity: Yes    Birth control/protection: Surgical  Other Topics Concern   Not on file  Social History Narrative   ** Merged History Encounter **       Social Determinants of Health   Financial Resource Strain: Not on file  Food Insecurity: Not on file  Transportation Needs: Not on file  Physical Activity: Not on file  Stress: Not on file  Social Connections: Not on file  Intimate Partner Violence: Not on file     Physical Exam   Vitals:   09/18/21 0500 09/18/21 0530  BP: (!) 97/59 98/66  Pulse: 62 (!) 59  Resp: 15 17  Temp:    SpO2: 95% 97%    CONSTITUTIONAL:  well-appearing, NAD NEURO/PSYCH:  Alert and oriented x 3, no focal deficits EYES:  eyes equal and reactive ENT/NECK:  no LAD, no JVD CARDIO:  regular rate, well-perfused, normal S1 and S2 PULM:  CTAB no wheezing or rhonchi GI/GU:  non-distended, non-tender MSK/SPINE:  No gross deformities, no edema SKIN:  no rash, atraumatic   *Additional and/or pertinent findings included in MDM below  Diagnostic and  Interventional Summary    EKG Interpretation  Date/Time:  Friday September 18 2021 03:18:59 EST Ventricular Rate:  63 PR Interval:  171 QRS Duration: 94 QT Interval:  433 QTC Calculation: 444 R Axis:   14 Text Interpretation: Sinus rhythm Low voltage, precordial leads Abnormal R-wave progression, early transition Confirmed by Kennis Carina [phone]) on 09/18/2021 4:04:23 AM       Labs Reviewed  BASIC METABOLIC PANEL - Abnormal; Notable for the following components:      Result Value   Glucose, Bld 111 (*)    All other components within normal limits  CBC  TROPONIN I (HIGH SENSITIVITY)  TROPONIN I (HIGH SENSITIVITY)    DG Chest Port 1 View  Final Result      Medications - No data to display   Procedures  /  Critical Care Procedures  ED Course and Medical Decision Making  Initial Impression and Ddx Suspect benign etiology such as stress or gerd.  ACS considered but felt unlikely.  Doubt PE.   Awaiting ekg, cxr, labs with trop  Past medical/surgical history that increases complexity of ED encounter: None  Interpretation of Diagnostics I personally reviewed the EKG and my interpretation is as follows: Sinus rhythm    Laboratory evaluation is reassuring with normal blood counts, electrolyte counts.  Troponin negative x2  Patient Reassessment and Ultimate Disposition/Management Patient continues to look and feel well, with reassuring work-up patient is appropriate for discharge.  Patient management required discussion with the following services or consulting groups:  None  Complexity of Problems Addressed Acute illness or injury that poses threat of life of bodily function  Additional Data Reviewed and Analyzed Further history obtained from: Past medical history and medications listed in the EMR  Factors Impacting ED Encounter Risk None  Mary Sow. Pilar Plate, MD [facility] Emergency Medicine [facility] [email or web address]  Final Clinical Impressions(s) / ED Diagnoses     ICD-10-CM   1. Chest pain, unspecified type  R07.9       ED Discharge Orders     None        Discharge Instructions Discussed with and Provided to Patient:     Discharge Instructions      You were evaluated in the Emergency Department and after careful evaluation, we did not find any emergent condition requiring admission or further testing in the hospital.  Your exam/testing today was overall reassuring.  Please return to the Emergency Department if you experience any worsening of your condition.  Thank you for allowing [place] to be a part of your care.        Sabas Sous, MD 09/18/21 [phone]

## 2021-10-05 ENCOUNTER — Telehealth: Payer: Self-pay | Admitting: *Deleted

## 2021-10-05 ENCOUNTER — Other Ambulatory Visit: Payer: Self-pay

## 2021-10-05 ENCOUNTER — Ambulatory Visit: Payer: Self-pay

## 2021-10-05 NOTE — Telephone Encounter (Signed)
Left multiple messages for pt to call me back.  Need to change nurse visit telephone appointment time.  Called using spanish interpreter and left voice mail.

## 2021-10-06 ENCOUNTER — Ambulatory Visit (INDEPENDENT_AMBULATORY_CARE_PROVIDER_SITE_OTHER): Payer: Self-pay | Admitting: *Deleted

## 2021-10-06 ENCOUNTER — Other Ambulatory Visit: Payer: Self-pay

## 2021-10-06 VITALS — Ht 62.0 in | Wt 143.0 lb

## 2021-10-06 DIAGNOSIS — Z1211 Encounter for screening for malignant neoplasm of colon: Secondary | ICD-10-CM

## 2021-10-06 NOTE — Progress Notes (Signed)
Utilized Malachi Bonds ([facility] interpreter) to conduct nurse visit by phone.

## 2021-10-06 NOTE — Progress Notes (Signed)
Gastroenterology Pre-Procedure Review  Request Date: 10/06/2021 Requesting Physician: Kizzie Furnish, PA-C, Last TCS done 08/15/2002 by Dr. Jena Gauss, normal colon  PATIENT REVIEW QUESTIONS: The patient responded to the following health history questions as indicated:    1. Diabetes Melitis: no 2. Joint replacements in the past 12 months: no 3. Major health problems in the past 3 months: no 4. Has an artificial valve or MVP: no 5. Has a defibrillator: no 6. Has been advised in past to take antibiotics in advance of a procedure like teeth cleaning: no 7. Family history of colon cancer: no 8. Alcohol Use: no 9. Illicit drug Use: no 10. History of sleep apnea: no 11. History of coronary artery or other vascular stents placed within the last 12 months: no 12. History of any prior anesthesia complications: no 13. Body mass index is 26.16 kg/m.    MEDICATIONS & ALLERGIES:    Patient reports the following regarding taking any blood thinners:   Plavix? no Aspirin? no Coumadin? no Brilinta? no Xarelto? no Eliquis? no Pradaxa? no Savaysa? no Effient? no  Patient confirms/reports the following medications:  Current Outpatient Medications  Medication Sig Dispense Refill   butalbital-acetaminophen-caffeine (FIORICET) 50-325-40 MG tablet Take 1 tablet by mouth every 6 (six) hours as needed for headache or migraine. (Patient taking differently: Take 1 tablet by mouth as needed for headache or migraine.) 14 tablet 0   hydrOXYzine (VISTARIL) 50 MG capsule Take 50 mg by mouth at bedtime as needed.     lovastatin (MEVACOR) 20 MG tablet Take 40 mg by mouth at bedtime.     neomycin-polymyxin b-dexamethasone (MAXITROL) 3.5-10000-0.1 OINT as needed.     nitrofurantoin, macrocrystal-monohydrate, (MACROBID) 100 MG capsule Take 100 mg by mouth 2 (two) times daily.     Omega-3 Fatty Acids (FISH OIL) 1000 MG CAPS Take 1 capsule by mouth 2 (two) times daily.     No current facility-administered medications  for this visit.    Patient confirms/reports the following allergies:  Allergies  Allergen Reactions   Oxycodone Nausea Only    No orders of the defined types were placed in this encounter.   AUTHORIZATION INFORMATION Primary Insurance: Self Pay  SCHEDULE INFORMATION: Procedure has been scheduled as follows:  Date: , Time:   Location: [facility] with Dr. Jena Gauss  This Gastroenterology Pre-Precedure Review Form is being routed to the following provider(s): Lewie Loron, NP

## 2021-10-14 NOTE — Progress Notes (Signed)
ASA 2. Appropriate.  ?

## 2021-10-20 NOTE — Progress Notes (Signed)
Spoke to pt.  She informed me that she will call us back in the future.  She is self pay and doesn't have the money right now for procedure.

## 2022-05-30 ENCOUNTER — Other Ambulatory Visit: Payer: Self-pay

## 2022-05-30 ENCOUNTER — Emergency Department (HOSPITAL_COMMUNITY): Payer: Self-pay

## 2022-05-30 ENCOUNTER — Emergency Department (HOSPITAL_COMMUNITY)
Admission: EM | Admit: 2022-05-30 | Discharge: 2022-05-30 | Disposition: A | Discharge status: Home / Self Care | Payer: Self-pay | Attending: Emergency Medicine | Admitting: Emergency Medicine

## 2022-05-30 DIAGNOSIS — R0789 Other chest pain: Secondary | ICD-10-CM | POA: Insufficient documentation

## 2022-05-30 LAB — CBC
HCT: 40 % (ref 36.0–46.0)
Hemoglobin: 13.7 g/dL (ref 12.0–15.0)
MCH: 31.5 pg (ref 26.0–34.0)
MCHC: 34.3 g/dL (ref 30.0–36.0)
MCV: 92 fL (ref 80.0–100.0)
Platelets: 185 10*3/uL (ref 150–400)
RBC: 4.35 MIL/uL (ref 3.87–5.11)
RDW: 12.3 % (ref 11.5–15.5)
WBC: 6.5 10*3/uL (ref 4.0–10.5)
nRBC: 0 % (ref 0.0–0.2)

## 2022-05-30 LAB — TROPONIN I (HIGH SENSITIVITY)
Troponin I (High Sensitivity): <2 ng/L (ref <18)
Troponin I (High Sensitivity): <2 ng/L (ref <18)

## 2022-05-30 LAB — BASIC METABOLIC PANEL
Anion gap: 10 (ref 5–15)
BUN: 13 mg/dL (ref 6–20)
CO2: 25 mmol/L (ref 22–32)
Calcium: 9.9 mg/dL (ref 8.9–10.3)
Chloride: 102 mmol/L (ref 98–111)
Creatinine, Ser: 0.41 mg/dL — ABNORMAL LOW (ref 0.44–1.00)
GFR, Estimated: >60 mL/min (ref >60)
Glucose, Bld: 105 mg/dL — ABNORMAL HIGH (ref 70–99)
Potassium: 3.4 mmol/L — ABNORMAL LOW (ref 3.5–5.1)
Sodium: 137 mmol/L (ref 135–145)

## 2022-05-30 NOTE — ED Provider Notes (Signed)
[facility] EMERGENCY DEPARTMENT  Provider Note  CSN: [id number] Arrival date & time: 05/30/22 0222  History Chief Complaint  Patient presents with   Chest Pain    [place] is a 51 y.o. female with history of pre-DM,and HLD but no known CAD reports about 2300hrs earlier tonight, she was getting ready for bed when she had onset of mild midsternal chest pressure with a tingling in L arm. No SOB, N/V or diaphoresis. Has had similar before.    Home Medications Prior to Admission medications   Medication Sig Start Date End Date Taking? Authorizing Provider  butalbital-acetaminophen-caffeine (FIORICET) 50-325-40 MG tablet Take 1 tablet by mouth every 6 (six) hours as needed for headache or migraine. Patient taking differently: Take 1 tablet by mouth as needed for headache or migraine. 01/24/19   Lavina Hamman, MD  hydrOXYzine (VISTARIL) 50 MG capsule Take 50 mg by mouth at bedtime as needed. 07/22/21   [provider]  lovastatin (MEVACOR) 20 MG tablet Take 40 mg by mouth at bedtime. 01/09/19   [provider]  neomycin-polymyxin b-dexamethasone (MAXITROL) 3.5-10000-0.1 OINT as needed. 07/27/21   [provider]  nitrofurantoin, macrocrystal-monohydrate, (MACROBID) 100 MG capsule Take 100 mg by mouth 2 (two) times daily. 07/27/21   [provider]  Omega-3 Fatty Acids (FISH OIL) 1000 MG CAPS Take 1 capsule by mouth 2 (two) times daily.    [provider]     Allergies    Oxycodone   Review of Systems   Review of Systems Please see HPI for pertinent positives and negatives  Physical Exam BP 98/62   Pulse 64   Temp 98 F (36.7 C) (Oral)   Resp 15   Ht 5\' 2"  (1.575 m)   Wt 64.9 kg   LMP 11/27/2015 Comment: pt thinks is entering menopause  SpO2 96%   BMI 26.17 kg/m   Physical Exam Vitals and nursing note reviewed.  Constitutional:      Appearance: Normal appearance.  HENT:     Head: Normocephalic and  atraumatic.     Nose: Nose normal.     Mouth/Throat:     Mouth: Mucous membranes are moist.  Eyes:     Extraocular Movements: Extraocular movements intact.     Conjunctiva/sclera: Conjunctivae normal.  Cardiovascular:     Rate and Rhythm: Normal rate.  Pulmonary:     Effort: Pulmonary effort is normal.     Breath sounds: Normal breath sounds.  Abdominal:     General: Abdomen is flat.     Palpations: Abdomen is soft.     Tenderness: There is no abdominal tenderness.  Musculoskeletal:        General: No swelling. Normal range of motion.     Cervical back: Neck supple.  Skin:    General: Skin is warm and dry.  Neurological:     General: No focal deficit present.     Mental Status: She is alert.  Psychiatric:        Mood and Affect: Mood normal.     ED Results / Procedures / Treatments   EKG EKG Interpretation  Date/Time:  Sunday May 30 2022 02:34:23 EDT Ventricular Rate:  62 PR Interval:  170 QRS Duration: 97 QT Interval:  419 QTC Calculation: 426 R Axis:   19 Text Interpretation: Sinus rhythm Low voltage, precordial leads Abnormal R-wave progression, early transition No significant change since last tracing Confirmed by Calvert Cantor [phone]) on 05/30/2022 3:02:01 AM  Procedures Procedures  Medications Ordered in the ED Medications - No data to display  Initial Impression and Plan  Patient here with atypical chest pains, exam is neg. She is low risk for ACS. Will check labs, including Trop x 2, CXR and EKG.   ED Course   Clinical Course as of 05/30/22 7846  Wynelle Link May 30, 2022  0305 CBC is normal.  [CS]  0308 I personally viewed the images from radiology studies and agree with radiologist interpretation: CXR is clear  [CS]  0341 BMP and Trop are normal. Will continue to monitor for delta.  [CS]  0654 Repeat Trop remains normal. Given her low risk for ACS, normal vitals and minimal symptoms do not feel further ED workup or admission are indicated. Will plan  discharge with outpatient follow up in Cardiology clinic if symptoms persist.  [CS]    Clinical Course User Index [CS] Pollyann Savoy, MD     MDM Rules/Calculators/A&P Medical Decision Making Given presenting complaint, I considered that admission might be necessary. After review of results from ED lab and/or imaging studies, admission to the hospital is not indicated at this time.    Problems Addressed: Atypical chest pain: acute illness or injury  Amount and/or Complexity of Data Reviewed Labs: ordered. Decision-making details documented in ED Course. Radiology: ordered and independent interpretation performed. Decision-making details documented in ED Course. ECG/medicine tests: ordered and independent interpretation performed. Decision-making details documented in ED Course.  Risk Decision regarding hospitalization.    Final Clinical Impression(s) / ED Diagnoses Final diagnoses:  Atypical chest pain    Rx / DC Orders ED Discharge Orders          Ordered    Ambulatory referral to Cardiology       Comments: If you have not heard from the Cardiology office within the next 72 hours please call [phone].   05/30/22 2440             Pollyann Savoy, MD 05/30/22 [phone]

## 2022-05-30 NOTE — ED Triage Notes (Signed)
Pt has had chest pressure and left arm pressure since 2300.  Denies N/V/D/SOB

## 2022-11-09 ENCOUNTER — Encounter (HOSPITAL_COMMUNITY): Payer: Self-pay

## 2022-11-09 ENCOUNTER — Emergency Department (HOSPITAL_COMMUNITY): Payer: Self-pay

## 2022-11-09 ENCOUNTER — Emergency Department (HOSPITAL_COMMUNITY)
Admission: EM | Admit: 2022-11-09 | Discharge: 2022-11-09 | Disposition: A | Discharge status: Home / Self Care | Payer: Self-pay | Attending: Emergency Medicine | Admitting: Emergency Medicine

## 2022-11-09 ENCOUNTER — Other Ambulatory Visit: Payer: Self-pay

## 2022-11-09 DIAGNOSIS — R1013 Epigastric pain: Secondary | ICD-10-CM | POA: Insufficient documentation

## 2022-11-09 LAB — COMPREHENSIVE METABOLIC PANEL
ALT: 23 U/L (ref 0–44)
AST: 21 U/L (ref 15–41)
Albumin: 4 g/dL (ref 3.5–5.0)
Alkaline Phosphatase: 87 U/L (ref 38–126)
Anion gap: 9 (ref 5–15)
BUN: 7 mg/dL (ref 6–20)
CO2: 25 mmol/L (ref 22–32)
Calcium: 8.8 mg/dL — ABNORMAL LOW (ref 8.9–10.3)
Chloride: 98 mmol/L (ref 98–111)
Creatinine, Ser: 0.5 mg/dL (ref 0.44–1.00)
GFR, Estimated: >60 mL/min (ref >60)
Glucose, Bld: 110 mg/dL — ABNORMAL HIGH (ref 70–99)
Potassium: 3.7 mmol/L (ref 3.5–5.1)
Sodium: 132 mmol/L — ABNORMAL LOW (ref 135–145)
Total Bilirubin: 1 mg/dL (ref 0.3–1.2)
Total Protein: 7.4 g/dL (ref 6.5–8.1)

## 2022-11-09 LAB — URINALYSIS, W/ REFLEX TO CULTURE (INFECTION SUSPECTED)
Bacteria, UA: NONE SEEN
Bilirubin Urine: NEGATIVE
Glucose, UA: NEGATIVE mg/dL
Ketones, ur: NEGATIVE mg/dL
Nitrite: NEGATIVE
Protein, ur: NEGATIVE mg/dL
Specific Gravity, Urine: 1.005 (ref 1.005–1.030)
pH: 8 (ref 5.0–8.0)

## 2022-11-09 LAB — CBC WITH DIFFERENTIAL/PLATELET
Abs Immature Granulocytes: 0.01 10*3/uL (ref 0.00–0.07)
Basophils Absolute: 0 10*3/uL (ref 0.0–0.1)
Basophils Relative: 0 %
Eosinophils Absolute: 0.1 10*3/uL (ref 0.0–0.5)
Eosinophils Relative: 2 %
HCT: 38.9 % (ref 36.0–46.0)
Hemoglobin: 13.3 g/dL (ref 12.0–15.0)
Immature Granulocytes: 0 %
Lymphocytes Relative: 16 %
Lymphs Abs: 0.9 10*3/uL (ref 0.7–4.0)
MCH: 31.2 pg (ref 26.0–34.0)
MCHC: 34.2 g/dL (ref 30.0–36.0)
MCV: 91.3 fL (ref 80.0–100.0)
Monocytes Absolute: 0.4 10*3/uL (ref 0.1–1.0)
Monocytes Relative: 6 %
Neutro Abs: 4.5 10*3/uL (ref 1.7–7.7)
Neutrophils Relative %: 76 %
Platelets: 160 10*3/uL (ref 150–400)
RBC: 4.26 MIL/uL (ref 3.87–5.11)
RDW: 12.2 % (ref 11.5–15.5)
WBC: 6 10*3/uL (ref 4.0–10.5)
nRBC: 0 % (ref 0.0–0.2)

## 2022-11-09 LAB — LIPASE, BLOOD: Lipase: 25 U/L (ref 11–51)

## 2022-11-09 MED ORDER — ONDANSETRON HCL 4 MG/2ML IJ SOLN
4.0000 mg | Freq: Once | INTRAMUSCULAR | Status: AC
  Administered 2022-11-09: 4 mg via INTRAVENOUS
  Filled 2022-11-09: qty 2

## 2022-11-09 MED ORDER — SODIUM CHLORIDE 0.9 % IV BOLUS
1000.0000 mL | Freq: Once | INTRAVENOUS | Status: AC
  Administered 2022-11-09: 1000 mL via INTRAVENOUS

## 2022-11-09 MED ORDER — TRAMADOL HCL 50 MG PO TABS: 50.0000 mg | ORAL_TABLET | Freq: Four times a day (QID) | ORAL | 0 refills | Status: AC | PRN

## 2022-11-09 MED ORDER — IOHEXOL 300 MG/ML  SOLN
100.0000 mL | Freq: Once | INTRAMUSCULAR | Status: AC | PRN
  Administered 2022-11-09: 100 mL via INTRAVENOUS

## 2022-11-09 MED ORDER — MORPHINE SULFATE (PF) 4 MG/ML IV SOLN
4.0000 mg | Freq: Once | INTRAVENOUS | Status: AC
  Administered 2022-11-09: 4 mg via INTRAVENOUS
  Filled 2022-11-09: qty 1

## 2022-11-09 NOTE — ED Triage Notes (Signed)
Complaining of abdominal pain and bloating that started yesterday, denies any nausea or vomiting but has had diarrhea. Said that her head hurts as well and she has a dry mouth.

## 2022-11-09 NOTE — ED Provider Notes (Signed)
[facility] Provider Note   CSN: [id number] Arrival date & time: 11/09/22  0257     History  Chief Complaint  Patient presents with   Abdominal Pain    Mary [name] is a 52 y.o. female.  Patient is a 52 year old female with past medical history of hyperlipidemia, prediabetes, GERD, and anxiety.  Patient presenting today with complaints of abdominal pain.  She describes epigastric discomfort that started yesterday morning shortly after she woke up.  She reports nausea, but no vomiting.  She has had 1 episode of diarrhea that was nonbloody.  She describes a constant pain to the epigastric region and feeling as if she is "bloated".  She denies any ill contacts and denies having consumed any suspicious foods.  The history is provided by the patient.       Home Medications Prior to Admission medications   Medication Sig Start Date End Date Taking? Authorizing Provider  butalbital-acetaminophen-caffeine (FIORICET) 50-325-40 MG tablet Take 1 tablet by mouth every 6 (six) hours as needed for headache or migraine. Patient taking differently: Take 1 tablet by mouth as needed for headache or migraine. 01/24/19   Lavina Hamman, MD  hydrOXYzine (VISTARIL) 50 MG capsule Take 50 mg by mouth at bedtime as needed. 07/22/21   [provider]  lovastatin (MEVACOR) 20 MG tablet Take 40 mg by mouth at bedtime. 01/09/19   [provider]  neomycin-polymyxin b-dexamethasone (MAXITROL) 3.5-10000-0.1 OINT as needed. 07/27/21   [provider]  nitrofurantoin, macrocrystal-monohydrate, (MACROBID) 100 MG capsule Take 100 mg by mouth 2 (two) times daily. 07/27/21   [provider]  Omega-3 Fatty Acids (FISH OIL) 1000 MG CAPS Take 1 capsule by mouth 2 (two) times daily.    [provider]      Allergies    Oxycodone    Review of Systems   Review of Systems  All other systems reviewed and are  negative.   Physical Exam Updated Vital Signs BP (!) 109/51 (BP Location: Right Arm)   Pulse 86   Temp 98.6 F (37 C) (Oral)   Resp 18   Ht 5\' 2"  (1.575 m)   Wt 64 kg   LMP 11/27/2015 Comment: pt thinks is entering menopause  SpO2 98%   BMI 25.79 kg/m  Physical Exam Vitals and nursing note reviewed.  Constitutional:      General: She is not in acute distress.    Appearance: She is well-developed. She is not diaphoretic.  HENT:     Head: Normocephalic and atraumatic.  Cardiovascular:     Rate and Rhythm: Normal rate and regular rhythm.     Heart sounds: No murmur heard.    No friction rub. No gallop.  Pulmonary:     Effort: Pulmonary effort is normal. No respiratory distress.     Breath sounds: Normal breath sounds. No wheezing.  Abdominal:     General: Bowel sounds are normal. There is no distension.     Palpations: Abdomen is soft.     Tenderness: There is abdominal tenderness in the epigastric area. There is no right CVA tenderness, left CVA tenderness, guarding or rebound.  Musculoskeletal:        General: Normal range of motion.     Cervical back: Normal range of motion and neck supple.  Skin:    General: Skin is warm and dry.  Neurological:     General: No focal deficit present.     Mental Status:  She is alert and oriented to person, place, and time.     ED Results / Procedures / Treatments   Labs (all labs ordered are listed, but only abnormal results are displayed) Labs Reviewed  COMPREHENSIVE METABOLIC PANEL  CBC WITH DIFFERENTIAL/PLATELET  LIPASE, BLOOD  URINALYSIS, W/ REFLEX TO CULTURE (INFECTION SUSPECTED)    EKG None  Radiology No results found.  Procedures Procedures    Medications Ordered in ED Medications  sodium chloride 0.9 % bolus 1,000 mL (has no administration in time range)  ondansetron (ZOFRAN) injection 4 mg (has no administration in time range)  morphine (PF) 4 MG/ML injection 4 mg (has no administration in time range)     ED Course/ Medical Decision Making/ A&P  Patient is a 52 year old female presenting with complaints of abdominal pain as described in the HPI.  She arrives here with stable vital signs and is afebrile.  She has some tenderness in the epigastric region, but no peritoneal signs.  Workup initiated including CBC, CMP, and lipase.  All studies are basically unremarkable.  Urinalysis showing no evidence for infection.  CT scan of the abdomen and pelvis obtained showing gastroenteritis which I suspect to be the cause of her discomfort.  There was also a density along the anterior wall of the gallbladder which could represent a polyp or small adherent stone.  Either way, I doubt that this is the cause of her symptoms.  There are no inflammatory changes to the gallbladder and her LFTs are normal.  Patient was given IV fluids along with Zofran and morphine.  She is now feeling significantly improved.  I feel as though she can safely be discharged and that I have ruled out emergent pathology.  Final Clinical Impression(s) / ED Diagnoses Final diagnoses:  None    Rx / DC Orders ED Discharge Orders     None         Veryl Speak, MD 11/09/22 [phone]

## 2022-11-09 NOTE — Discharge Instructions (Signed)
Begin taking tramadol as prescribed.  Clear liquids for the next 12 hours, then slowly advance diet to normal as tolerated.  Return to the emergency department if your symptoms significantly worsen or change.

## 2022-12-09 ENCOUNTER — Telehealth: Payer: Self-pay

## 2022-12-09 NOTE — Telephone Encounter (Signed)
Made first attempt to f/u with pt by phone but pt was unavailable at the time, however a text message was also sent for pt to followup   This call was in attempt to provide nurse case management to ensure pt did not have any additional questions or concerns regarding any post hospital instruction,medical concerns, obtaining her medications and serving as a reminder of any upcoming appointments.    Will attempt to make additional phone contact, if pt does attempt  to return call first

## 2022-12-09 NOTE — Telephone Encounter (Signed)
Made 2 attempts to f/u with pt by phone but pt was once again unavailable   This call was in attempt to provide nurse case management to ensure pt did not have any additional questions or concerns regarding any post hospital instruction, medical concerns, obtaining her medications and serving as a reminder of any upcoming appointments.    However, in review of [place] chart for pt she has followed with her PCP at [facility] on 4.12.24 as since he ER visit.

## 2022-12-15 ENCOUNTER — Other Ambulatory Visit (HOSPITAL_COMMUNITY): Payer: Self-pay | Admitting: Obstetrics and Gynecology

## 2022-12-15 DIAGNOSIS — Z1231 Encounter for screening mammogram for malignant neoplasm of breast: Secondary | ICD-10-CM

## 2022-12-17 ENCOUNTER — Encounter (HOSPITAL_COMMUNITY): Payer: Self-pay

## 2022-12-17 ENCOUNTER — Other Ambulatory Visit: Payer: Self-pay

## 2022-12-17 ENCOUNTER — Ambulatory Visit (HOSPITAL_COMMUNITY)
Admission: RE | Admit: 2022-12-17 | Discharge: 2022-12-17 | Disposition: A | Discharge status: Home / Self Care | Payer: Self-pay | Source: Ambulatory Visit | Attending: Obstetrics and Gynecology | Admitting: Obstetrics and Gynecology

## 2022-12-17 ENCOUNTER — Inpatient Hospital Stay: Payer: Self-pay | Attending: Obstetrics and Gynecology | Admitting: Hematology and Oncology

## 2022-12-17 VITALS — BP 103/46 | Wt 143.0 lb

## 2022-12-17 DIAGNOSIS — Z1231 Encounter for screening mammogram for malignant neoplasm of breast: Secondary | ICD-10-CM

## 2022-12-17 DIAGNOSIS — Z1211 Encounter for screening for malignant neoplasm of colon: Secondary | ICD-10-CM

## 2022-12-17 NOTE — Patient Instructions (Addendum)
Taught Mary [name] about self breast awareness and gave educational materials to take home. Patient did not need a Pap smear today due to last Pap smear was on 08/20/2021  per patient. Let her know [facility] will cover Pap smears every 5 years unless has a history of abnormal Pap smears. Referred patient to [facility] for diagnostic mammogram. Appointment scheduled for 12/17/22. Patient aware of appointment and will be there. Let patient know will follow up with her within the next couple weeks with results. Mary [name] verbalized understanding.  Pascal Lux, NP 1:04 PM

## 2022-12-17 NOTE — Progress Notes (Unsigned)
Ms. Mary [name] is a 52 y.o. female who presents to [facility] clinic today with no complaints.    Pap Smear: Pap not smear completed today. Last Pap smear was 08/20/2021 and was normal. Per patient has no history of an abnormal Pap smear. Last Pap smear result is available in Epic. 03/01/2019 Normal with negative HPV   Physical exam: Breasts Breasts symmetrical. No skin abnormalities bilateral breasts. No nipple retraction bilateral breasts. No nipple discharge bilateral breasts. No lymphadenopathy. No lumps palpated bilateral breasts.  MS DIGITAL SCREENING TOMO BILATERAL  Result Date: 08/31/2021 CLINICAL DATA:  Screening. EXAM: DIGITAL SCREENING BILATERAL MAMMOGRAM WITH TOMOSYNTHESIS AND CAD TECHNIQUE: Bilateral screening digital craniocaudal and mediolateral oblique mammograms were obtained. Bilateral screening digital breast tomosynthesis was performed. The images were evaluated with computer-aided detection. COMPARISON:  Previous exam(s). ACR Breast Density Category b: There are scattered areas of fibroglandular density. FINDINGS: There are no findings suspicious for malignancy. IMPRESSION: No mammographic evidence of malignancy. A result letter of this screening mammogram will be mailed directly to the patient. RECOMMENDATION: Screening mammogram in one year. (Code:SM-B-01Y) BI-RADS CATEGORY  1: Negative. Electronically Signed   By: Baird Lyons M.D.   On: 08/31/2021 09:18   MS DIGITAL SCREENING TOMO BILATERAL  Result Date: 07/30/2020 CLINICAL DATA:  Screening. EXAM: DIGITAL SCREENING BILATERAL MAMMOGRAM WITH TOMO AND CAD COMPARISON:  Previous exam(s). ACR Breast Density Category b: There are scattered areas of fibroglandular density. FINDINGS: There are no findings suspicious for malignancy. Images were processed with CAD. IMPRESSION: No mammographic evidence of malignancy. A result letter of this screening mammogram will be mailed directly to the patient. RECOMMENDATION:  Screening mammogram in one year. (Code:SM-B-01Y) BI-RADS CATEGORY  1: Negative. Electronically Signed   By: Bary Richard M.D.   On: 07/30/2020 13:21   MM 3D SCREEN BREAST BILATERAL  Result Date: 03/14/2019 CLINICAL DATA:  Screening. EXAM: DIGITAL SCREENING BILATERAL MAMMOGRAM WITH TOMO AND CAD COMPARISON:  Previous exam(s). ACR Breast Density Category b: There are scattered areas of fibroglandular density. FINDINGS: There are no findings suspicious for malignancy. Images were processed with CAD. IMPRESSION: No mammographic evidence of malignancy. A result letter of this screening mammogram will be mailed directly to the patient. RECOMMENDATION: Screening mammogram in one year. (Code:SM-[id number]) BI-RADS CATEGORY  1: Negative. Electronically Signed   By: Amie Portland M.D.   On: 03/14/2019 09:20         Pelvic/Bimanual Pap is not indicated today    Smoking History: Patient has never smoked and was not referred to quit line.    Patient Navigation: Patient education provided. Access to services provided for patient through [facility] program. [facility] interpreter provided. [employer] transportation provided   Colorectal Cancer Screening: Per patient has never had colonoscopy completed No complaints today.    Breast and Cervical Cancer Risk Assessment: Patient does not have family history of breast cancer, known genetic mutations, or radiation treatment to the chest before age 25. Patient does not have history of cervical dysplasia, immunocompromised, or DES exposure in-utero.  Risk Assessment   No risk assessment data     A: [facility] exam without pap smear No complaints with benign exam.   P: [facility]. Appointment scheduled 12/17/22.  Ilda Basset A, NP 12/17/2022 1:03 PM

## 2023-01-02 LAB — FECAL OCCULT BLOOD, IMMUNOCHEMICAL: Fecal Occult Bld: NEGATIVE
